# Patient Record
Sex: Male | Born: 1995
Health system: Southern US, Community
[De-identification: ages and names within clinical notes are randomized; demographics above are authoritative.]

## PROBLEM LIST (undated history)

## (undated) DIAGNOSIS — Z889 Allergy status to unspecified drugs, medicaments and biological substances status: Secondary | ICD-10-CM

## (undated) DIAGNOSIS — J302 Other seasonal allergic rhinitis: Secondary | ICD-10-CM

## (undated) DIAGNOSIS — F419 Anxiety disorder, unspecified: Secondary | ICD-10-CM

---
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
## 2017-03-13 ENCOUNTER — Encounter: Payer: Self-pay | Admitting: Family Medicine

## 2017-03-13 ENCOUNTER — Other Ambulatory Visit (HOSPITAL_COMMUNITY)
Admission: RE | Admit: 2017-03-13 | Discharge: 2017-03-13 | Disposition: A | Discharge status: Home / Self Care | Payer: Medicaid Other | Source: Ambulatory Visit | Attending: Family Medicine | Admitting: Family Medicine

## 2017-03-13 ENCOUNTER — Ambulatory Visit (INDEPENDENT_AMBULATORY_CARE_PROVIDER_SITE_OTHER): Payer: Medicaid Other | Admitting: Family Medicine

## 2017-03-13 VITALS — BP 110/70 | HR 53 | Temp 98.3°F | Wt 165.0 lb

## 2017-03-13 DIAGNOSIS — R300 Dysuria: Secondary | ICD-10-CM

## 2017-03-13 DIAGNOSIS — Z202 Contact with and (suspected) exposure to infections with a predominantly sexual mode of transmission: Secondary | ICD-10-CM | POA: Diagnosis present

## 2017-03-13 LAB — POCT URINALYSIS DIP (MANUAL ENTRY)
BILIRUBIN UA: NEGATIVE
Glucose, UA: NEGATIVE mg/dL
Ketones, POC UA: NEGATIVE mg/dL
LEUKOCYTES UA: NEGATIVE
NITRITE UA: NEGATIVE
PROTEIN UA: NEGATIVE mg/dL
RBC UA: NEGATIVE
Spec Grav, UA: >=1.03 — AB (ref 1.010–1.025)
UROBILINOGEN UA: 1 U/dL
pH, UA: 6 (ref 5.0–8.0)

## 2017-03-13 MED ORDER — AZITHROMYCIN 500 MG PO TABS
1000.0000 mg | ORAL_TABLET | Freq: Once | ORAL | Status: AC
  Administered 2017-03-13: 1000 mg via ORAL

## 2017-03-13 MED ORDER — CEFTRIAXONE SODIUM 250 MG IJ SOLR
250.0000 mg | Freq: Once | INTRAMUSCULAR | Status: AC
  Administered 2017-03-13: 250 mg via INTRAMUSCULAR

## 2017-03-13 NOTE — Patient Instructions (Signed)
I will contact you will the results of your labs.  If anything is abnormal, I will call you.  Otherwise, expect a copy to be mailed to you.  

## 2017-03-13 NOTE — Progress Notes (Signed)
   Subjective: CC: exposure to STI ZOX:WRUEAHPI:Christopher Townsend is a 21 y.o. male presenting to clinic today for same day appointment. PCP: Almon HerculesGonfa, Taye T, MD Concerns today include:  Patient reports that he has been having dysuria.  He notes that his partner told him she had chlamydia.  He notes he was treated for this in the past at urgent care.  He reports recent intercourse with her and thinks that he may have been reinfected.   He intermittently uses condoms.  Sexually active with females only.  Currently, only has 1 partner.Denies hematuria, penile discharge, genital lesions/ sores/ rashes, testicular swelling/ pain, nausea, vomiting, abdominal pain, fevers, chills.  He denies back pain, urgency, frequency.   No Known Allergies  Social Hx reviewed. MedHx, current medications and allergies reviewed.  Please see EMR. ROS: Per HPI  Objective: Office vital signs reviewed. BP 110/70   Pulse (!) 53   Temp 98.3 F (36.8 C) (Oral)   Wt 165 lb (74.8 kg)   SpO2 98%   Physical Examination:  General: Awake, alert, thin male, No acute distress HEENT: Normal, MMM, no oral lesions Cardio: regular rate and rhythm, S1S2 heard, no murmurs appreciated Pulm: clear to auscultation bilaterally, no wheezes, rhonchi or rales; normal work of breathing on room air GI: soft, non-tender, non-distended, bowel sounds present x4, no hepatomegaly, no splenomegaly, no masses; no suprapubic TTP MSK: no CVA TTP Skin: dry; intact; no rashes or lesions  Results for orders placed or performed in visit on 03/13/17 (from the past 24 hour(s))  POCT urinalysis dipstick     Status: Abnormal   Collection Time: 03/13/17  9:03 AM  Result Value Ref Range   Color, UA yellow yellow   Clarity, UA clear clear   Glucose, UA negative negative mg/dL   Bilirubin, UA negative negative   Ketones, POC UA negative negative mg/dL   Spec Grav, UA >=5.409>=1.030 (A) 1.010 - 1.025   Blood, UA negative negative   pH, UA 6.0 5.0 - 8.0   Protein Ur,  POC negative negative mg/dL   Urobilinogen, UA 1.0 0.2 or 1.0 E.U./dL   Nitrite, UA Negative Negative   Leukocytes, UA Negative Negative    Assessment/ Plan: 21 y.o. male   1. Possible exposure to STD.  Given known exposure, will empirically treat in office.  Discussed with patient, who would like to proceed with treatment while waiting for cytology results. - Urine cytology ancillary only, check GC/CT - Declines HIV/ RPR testing. - cefTRIAXone (ROCEPHIN) injection 250 mg; Inject 250 mg into the muscle once. - azithromycin (ZITHROMAX) tablet 1,000 mg; Take 2 tablets (1,000 mg total) by mouth once. - Safe sex practices reinforced. - Condoms provided  2. Dysuria.  Symptoms likely related to STI but will r/o UTI - Urinalysis Dipstick  Follow up with PCP prn.  Raliegh IpAshly M Arvie Bartholomew, DO PGY-3, [facility]

## 2017-03-14 LAB — URINE CYTOLOGY ANCILLARY ONLY
CHLAMYDIA, DNA PROBE: NEGATIVE
Neisseria Gonorrhea: NEGATIVE
Trichomonas: NEGATIVE

## 2017-03-18 ENCOUNTER — Encounter: Payer: Self-pay | Admitting: Family Medicine

## 2017-03-18 NOTE — Progress Notes (Signed)
Please call patient and inform him of negative results.

## 2017-03-19 ENCOUNTER — Telehealth: Payer: Self-pay

## 2017-03-19 NOTE — Telephone Encounter (Signed)
-----   Message from Raliegh IpAshly M Gottschalk, DO sent at 03/18/2017  3:14 PM EDT ----- Please call patient and inform of negative result.  Letter also sent

## 2017-03-19 NOTE — Telephone Encounter (Signed)
LVM for pt to call the office. If he calls, please let him know the test results were negative. Dr. Nadine CountsGottschalk has also mailed a letter to the pt. Christopher SpillersSharon T Adalyn Townsend, CMA

## 2017-03-20 ENCOUNTER — Telehealth: Payer: Self-pay | Admitting: *Deleted

## 2017-03-20 NOTE — Telephone Encounter (Signed)
Pt was advised. ep °

## 2017-03-20 NOTE — Telephone Encounter (Signed)
-----   Message from Ashly M Gottschalk, DO sent at 03/18/2017  3:14 PM EDT ----- Please call patient and inform of negative result.  Letter also sent 

## 2017-03-20 NOTE — Telephone Encounter (Signed)
LVM to call office to inform pt of below. Zimmerman Rumple, Granville Whitefield D, CMA  

## 2017-03-21 ENCOUNTER — Other Ambulatory Visit (HOSPITAL_COMMUNITY)
Admission: RE | Admit: 2017-03-21 | Discharge: 2017-03-21 | Disposition: A | Discharge status: Home / Self Care | Payer: Medicaid Other | Source: Ambulatory Visit | Attending: Family Medicine | Admitting: Family Medicine

## 2017-03-21 ENCOUNTER — Encounter: Payer: Self-pay | Admitting: Internal Medicine

## 2017-03-21 ENCOUNTER — Ambulatory Visit (INDEPENDENT_AMBULATORY_CARE_PROVIDER_SITE_OTHER): Payer: Medicaid Other | Admitting: Internal Medicine

## 2017-03-21 VITALS — BP 115/60 | Temp 98.2°F | Wt 172.8 lb

## 2017-03-21 DIAGNOSIS — Z202 Contact with and (suspected) exposure to infections with a predominantly sexual mode of transmission: Secondary | ICD-10-CM | POA: Diagnosis not present

## 2017-03-21 DIAGNOSIS — N4889 Other specified disorders of penis: Secondary | ICD-10-CM | POA: Diagnosis not present

## 2017-03-21 MED ORDER — PENICILLIN G BENZATHINE 1200000 UNIT/2ML IM SUSP
1.2000 10*6.[IU] | Freq: Once | INTRAMUSCULAR | Status: AC
  Administered 2017-03-21: 1.2 10*6.[IU] via INTRAMUSCULAR

## 2017-03-21 NOTE — Progress Notes (Signed)
   Subjective:    Christopher Townsend - 21 y.o. male MRN [id number]  Date of birth: [birth year]  HPI  Christopher Townsend is here for concern for STD and penile lesions.  Penile Lesions: Present for about 1-2 weeks around the head of his penis. Are not painful or pruritic. Has not noticed any drainage from the lesions. Denies blistering of the lesions. He reports that he is sexually active with one male partner. They only engage in vaginal intercourse. His partner has recently tested +Chlamydia and [facility] was [facility] one week prior to this office visit for STD screening. He was treated presumptively for Gonorrhea and Chlamydia at this visit with CTX and Azithromycin. He endorsed dysuria at this visit but reports it has resolved since treatment with abx. He denies hematuria and blood in semen. Reports that semen has been yellow in color.    Health Maintenance:  There are no preventive care reminders to display for this patient.  -  reports that he has never smoked. He has never used smokeless tobacco. - Review of Systems: Per HPI. - Past Medical History: Patient Active Problem List   Diagnosis Date Noted  . Tinea cruris 07/07/2015  . Viral respiratory illness 08/26/2014  . Generalized anxiety disorder 04/22/2014  . Pre-syncope 04/22/2014  . Mild tetrahydrocannabinol (THC) abuse 04/14/2014  . Tinea corporis 02/23/2014  . Nausea vomiting and diarrhea 11/21/2012  . Weight loss, intentional 11/23/2011   - Medications: reviewed and updated   Objective:   Physical Exam BP 115/60   Temp 98.2 F (36.8 C) (Oral)   Wt 172 lb 12.8 oz (78.4 kg)   SpO2 98%  Gen: NAD, alert, cooperative with exam, well-appearing GU: Multiple erythematous papules around the sulcus of the glans of the penis without drainage.  Neuro: A&Ox3. CN II-XII grossly intact. Strength and sensation of extremities intact and equal. Gait normal. Psych: good insight, alert and oriented   Assessment & Plan:   1. STD exposure/Penile  Papules  Patient with recent possible STD exposure. Lesions appear most consistent with HSV as there are multiple papules present; however, lesions are not painful nor vesicular. There is concern that this may possibly be an atypical appearance of primary syphilis. Have sent culture for HSV and will treat empirically for syphilis with IM PCN in clinic today. Have obtained RPR. If negative, will recommend repeat RPR in 2-4 weeks as serologic testing in early primary syphilis may be falsely negative. Also repeating GC/Chlamydia testing today. Lesions do not appear consistent with benign pearly penile papules.  - Urine cytology ancillary only - Herpes simplex virus culture - penicillin g benzathine (BICILLIN LA) 1200000 UNIT/2ML injection 1.2 Million Units; Inject 2 mLs (1.2 Million Units total) into the muscle once. - RPR - HIV antibody (with reflex)     Marcy Sirenatherine Wallace, D.O. 03/21/2017, 9:40 AM PGY-2, [facility]

## 2017-03-21 NOTE — Assessment & Plan Note (Signed)
Patient with recent possible STD exposure. Lesions appear most consistent with HSV as there are multiple papules present; however, lesions are not painful nor vesicular. There is concern that this may possibly be an atypical appearance of primary syphilis. Have sent culture for HSV and will treat empirically for syphilis with IM PCN in clinic today. Have obtained RPR. If negative, will recommend repeat RPR in 2-4 weeks as serologic testing in early primary syphilis may be falsely negative. Also repeating GC/Chlamydia testing today. Lesions do not appear consistent with benign pearly penile papules.  - Urine cytology ancillary only - Herpes simplex virus culture - penicillin g benzathine (BICILLIN LA) 1200000 UNIT/2ML injection 1.2 Million Units; Inject 2 mLs (1.2 Million Units total) into the muscle once. - RPR - HIV antibody (with reflex)

## 2017-03-21 NOTE — Patient Instructions (Signed)
We will call you with your lab results and give you further instructions. Please practice safe sex using condoms, as they are the only method to prevent transmission of STDs.    Safe Sex Practicing safe sex means taking steps before and during sex to reduce your risk of:  Getting an STD (sexually transmitted disease).  Giving your partner an STD.  Unwanted pregnancy.  How can I practice safe sex?  To practice safe sex:  Limit your sexual partners to only one partner who is having sex with only you.  Avoid using alcohol and recreational drugs before having sex. These substances can affect your judgment.  Before having sex with a new partner: ? Talk to your partner about past partners, past STDs, and drug use. ? You and your partner should be screened for STDs and discuss the results with each other.  Check your body regularly for sores, blisters, rashes, or unusual discharge. If you notice any of these problems, visit your health care provider.  If you have symptoms of an infection or you are being treated for an STD, avoid sexual contact.  While having sex, use a condom. Make sure to: ? Use a condom every time you have vaginal, oral, or anal sex. Both females and males should wear condoms during oral sex. ? Keep condoms in place from the beginning to the end of sexual activity. ? Use a latex condom, if possible. Latex condoms offer the best protection. ? Use only water-based lubricants or oils to lubricate a condom. Using petroleum-based lubricants or oils will weaken the condom and increase the chance that it will break.  See your health care provider for regular screenings, exams, and tests for STDs.  Talk with your health care provider about the form of birth control (contraception) that is best for you.  Get vaccinated against hepatitis B and human papillomavirus (HPV).  If you are at risk of being infected with HIV (human immunodeficiency virus), talk with your health  care provider about taking a prescription medicine to prevent HIV infection. You are considered at risk for HIV if: ? You are a man who has sex with other men. ? You are a heterosexual man or woman who is sexually active with more than one partner. ? You take drugs by injection. ? You are sexually active with a partner who has HIV.  This information is not intended to replace advice given to you by your health care provider. Make sure you discuss any questions you have with your health care provider. Document Released: 10/18/2004 Document Revised: 01/25/2016 Document Reviewed: 07/31/2015 Elsevier Interactive Patient Education  2018 [employer].   Sexually Transmitted Disease A sexually transmitted disease (STD) is a disease or infection that may be passed (transmitted) from person to person, usually during sexual activity. This may happen by way of saliva, semen, blood, vaginal mucus, or urine. Common STDs include:  Gonorrhea.  Chlamydia.  Syphilis.  HIV and AIDS.  Genital herpes.  Hepatitis B and C.  Trichomonas.  Human papillomavirus (HPV).  Pubic lice.  Scabies.  Mites.  Bacterial vaginosis.  What are the causes? An STD may be caused by bacteria, a virus, or parasites. STDs are often transmitted during sexual activity if one person is infected. However, they may also be transmitted through nonsexual means. STDs may be transmitted after:  Sexual intercourse with an infected person.  Sharing sex toys with an infected person.  Sharing needles with an infected person or using unclean piercing or tattoo  needles.  Having intimate contact with the genitals, mouth, or rectal areas of an infected person.  Exposure to infected fluids during birth.  What are the signs or symptoms? Different STDs have different symptoms. Some people may not have any symptoms. If symptoms are present, they may include:  Painful or bloody urination.  Pain in the pelvis, abdomen, vagina,  anus, throat, or eyes.  A skin rash, itching, or irritation.  Growths, ulcerations, blisters, or sores in the genital and anal areas.  Abnormal vaginal discharge with or without bad odor.  Penile discharge in men.  Fever.  Pain or bleeding during sexual intercourse.  Swollen glands in the groin area.  Yellow skin and eyes (jaundice). This is seen with hepatitis.  Swollen testicles.  Infertility.  Sores and blisters in the mouth.  How is this diagnosed? To make a diagnosis, your health care provider may:  Take a medical history.  Perform a physical exam.  Take a sample of any discharge to examine.  Swab the throat, cervix, opening to the penis, rectum, or vagina for testing.  Test a sample of your first morning urine.  Perform blood tests.  Perform a Pap test, if this applies.  Perform a colposcopy.  Perform a laparoscopy.  How is this treated? Treatment depends on the STD. Some STDs may be treated but not cured.  Chlamydia, gonorrhea, trichomonas, and syphilis can be cured with antibiotic medicine.  Genital herpes, hepatitis, and HIV can be treated, but not cured, with prescribed medicines. The medicines lessen symptoms.  Genital warts from HPV can be treated with medicine or by freezing, burning (electrocautery), or surgery. Warts may come back.  HPV cannot be cured with medicine or surgery. However, abnormal areas may be removed from the cervix, vagina, or vulva.  If your diagnosis is confirmed, your recent sexual partners need treatment. This is true even if they are symptom-free or have a negative culture or evaluation. They should not have sex until their health care providers say it is okay.  Your health care provider may test you for infection again 3 months after treatment.  How is this prevented? Take these steps to reduce your risk of getting an STD:  Use latex condoms, dental dams, and water-soluble lubricants during sexual activity. Do not  use petroleum jelly or oils.  Avoid having multiple sex partners.  Do not have sex with someone who has other sex partners.  Do not have sex with anyone you do not know or who is at high risk for an STD.  Avoid risky sex practices that can break your skin.  Do not have sex if you have open sores on your mouth or skin.  Avoid drinking too much alcohol or taking illegal drugs. Alcohol and drugs can affect your judgment and put you in a vulnerable position.  Avoid engaging in oral and anal sex acts.  Get vaccinated for HPV and hepatitis. If you have not received these vaccines in the past, talk to your health care provider about whether one or both might be right for you.  If you are at risk of being infected with HIV, it is recommended that you take a prescription medicine daily to prevent HIV infection. This is called pre-exposure prophylaxis (PrEP). You are considered at risk if: ? You are a man who has sex with other men (MSM). ? You are a heterosexual man or woman and are sexually active with more than one partner. ? You take drugs by injection. ? You  are sexually active with a partner who has HIV.  Talk with your health care provider about whether you are at high risk of being infected with HIV. If you choose to begin PrEP, you should first be tested for HIV. You should then be tested every 3 months for as long as you are taking PrEP.  Contact a health care provider if:  See your health care provider.  Tell your sexual partner(s). They should be tested and treated for any STDs.  Do not have sex until your health care provider says it is okay. Get help right away if: Contact your health care provider right away if:  You have severe abdominal pain.  You are a man and notice swelling or pain in your testicles.  You are a woman and notice swelling or pain in your vagina.  This information is not intended to replace advice given to you by your health care provider. Make sure  you discuss any questions you have with your health care provider. Document Released: 12/01/2002 Document Revised: 03/30/2016 Document Reviewed: 03/31/2013 Elsevier Interactive Patient Education  2018 [employer].

## 2017-03-22 LAB — URINE CYTOLOGY ANCILLARY ONLY
Chlamydia: NEGATIVE
Neisseria Gonorrhea: NEGATIVE

## 2017-03-22 LAB — HIV ANTIBODY (ROUTINE TESTING W REFLEX): HIV SCREEN 4TH GENERATION: NONREACTIVE

## 2017-03-22 LAB — RPR: RPR Ser Ql: NONREACTIVE

## 2017-03-24 LAB — HERPES SIMPLEX VIRUS CULTURE

## 2017-03-25 ENCOUNTER — Telehealth: Payer: Self-pay | Admitting: Student

## 2017-03-25 NOTE — Telephone Encounter (Signed)
Pt would like someone to call back with lab results. ep

## 2017-03-25 NOTE — Telephone Encounter (Signed)
Pt is calling to get his results explained to him. Myriam Jacobsonjw

## 2017-03-26 ENCOUNTER — Other Ambulatory Visit: Payer: Self-pay | Admitting: Internal Medicine

## 2017-03-26 DIAGNOSIS — N4889 Other specified disorders of penis: Secondary | ICD-10-CM

## 2017-03-26 NOTE — Telephone Encounter (Signed)
Routing to Dr. Earlene PlaterWallace. She saw him and ordered those labs.  Thanks! Alwyn Renaye

## 2017-03-26 NOTE — Telephone Encounter (Signed)
Called to discuss lab results with patient. Informed him of negative Gonorrhea, Chlamydia, RPR, HIV, and HSV. Given lesions on penis present at prior office visit want to ensure RPR was not a false negative as serologic testing in early Syphilis is not as reliable. Will order repeat RPR to be performed. Have instructed patient to make lab appointment for approximately 2 weeks from now.   Patient is concerned that semen is still yellow and that this means that he has Chlamydia as his partner tested positive several weeks ago. Discussed with patient that [facility] on 6/20 and that testing from that office visit as well as from office visit 6/28 yielded negative results. Discussed not having sexual intercourse with partner until she is treated and re-tested. Have recommended follow up with PCP Gonfa for further discussion.   Marcy Sirenatherine Audrinna Sherman, D.O. 03/26/2017, 10:42 AM PGY-3, [facility]

## 2017-03-26 NOTE — Telephone Encounter (Signed)
Would like lab results

## 2017-04-16 ENCOUNTER — Ambulatory Visit (HOSPITAL_COMMUNITY)
Admission: EM | Admit: 2017-04-16 | Discharge: 2017-04-16 | Disposition: A | Discharge status: Home / Self Care | Payer: Medicaid Other | Attending: Family Medicine | Admitting: Family Medicine

## 2017-04-16 ENCOUNTER — Encounter (HOSPITAL_COMMUNITY): Payer: Self-pay | Admitting: Family Medicine

## 2017-04-16 DIAGNOSIS — J029 Acute pharyngitis, unspecified: Secondary | ICD-10-CM | POA: Diagnosis not present

## 2017-04-16 MED ORDER — OMEPRAZOLE 20 MG PO CPDR: 20.0000 mg | DELAYED_RELEASE_CAPSULE | Freq: Every day | ORAL | 0 refills | Status: DC

## 2017-04-16 NOTE — ED Triage Notes (Signed)
Pt here for over a week of sore throat and drainage in the back of throat.

## 2017-04-17 NOTE — ED Provider Notes (Signed)
  [facility]   [id number] 04/16/17 Arrival Time: 1721  ASSESSMENT & PLAN:  1. Sore throat    Given duration of symptoms I'm curious is this is GERD related.  Meds ordered this encounter  Medications  . omeprazole (PRILOSEC) 20 MG capsule    Sig: Take 1 capsule (20 mg total) by mouth daily.    Dispense:  21 capsule    Refill:  0   Will give trial of omeprazole for 3 weeks. He will f/u if not improving.  Reviewed expectations re: course of current medical issues. Questions answered. Outlined signs and symptoms indicating need for more acute intervention. Patient verbalized understanding. After Visit Summary given.   SUBJECTIVE:  Christopher Townsend is a 21 y.o. male who presents with complaint of sore throat. Almost daily for several months. Does not limit him. Worse in the morning. Afebrile. No neck soreness or swelling. No self-treatment. No resp symptoms. No h/o reflux symptoms. Able to eat and drink normally. No voice changes. No new exposures.  ROS: As per HPI.   OBJECTIVE:  Vitals:   04/16/17 1736  BP: 117/66  Pulse: (!) 50  Resp: 18  Temp: 98.2 F (36.8 C)  SpO2: 98%     General appearance: alert; no distress HEENT: throat with mild erythema; tonsils are normal Neck: supple; no LAD; FROM Extremities: no cyanosis or edema; symmetrical with no gross deformities Skin: warm and dry   No Known Allergies  PMHx, SurgHx, SocialHx, Medications, and Allergies were reviewed in the Visit Navigator and updated as appropriate.      Mardella LaymanHagler, Ethan Kasperski, MD 04/17/17 [phone]

## 2017-05-03 ENCOUNTER — Telehealth: Payer: Self-pay | Admitting: Student

## 2017-05-03 NOTE — Telephone Encounter (Signed)
Pt would like a referral to a dermatogist for inflamation  on his private area.  Please advise

## 2017-05-03 NOTE — Telephone Encounter (Signed)
I have not evaluated the patient for this problem. It seems he saw Dr. Earlene PlaterWallace about a month ago. Please call the patient and advise him to schedule an office visit. Thank you, Alwyn Renaye

## 2017-05-07 NOTE — Telephone Encounter (Signed)
Contacted pt and scheduled an appointment for 8/16 with PCP to be evaluated for this referral. Lamonte SakaiZimmerman Rumple, Luellen Howson D, CMA

## 2017-05-08 ENCOUNTER — Encounter (HOSPITAL_COMMUNITY): Payer: Self-pay | Admitting: *Deleted

## 2017-05-08 ENCOUNTER — Emergency Department (HOSPITAL_COMMUNITY)
Admission: EM | Admit: 2017-05-08 | Discharge: 2017-05-08 | Disposition: A | Discharge status: Home / Self Care | Payer: Medicaid Other | Attending: Emergency Medicine | Admitting: Emergency Medicine

## 2017-05-08 DIAGNOSIS — R369 Urethral discharge, unspecified: Secondary | ICD-10-CM | POA: Diagnosis present

## 2017-05-08 DIAGNOSIS — Z79899 Other long term (current) drug therapy: Secondary | ICD-10-CM | POA: Insufficient documentation

## 2017-05-08 DIAGNOSIS — Z711 Person with feared health complaint in whom no diagnosis is made: Secondary | ICD-10-CM

## 2017-05-08 LAB — URINALYSIS, ROUTINE W REFLEX MICROSCOPIC
Bilirubin Urine: NEGATIVE
Glucose, UA: NEGATIVE mg/dL
Hgb urine dipstick: NEGATIVE
Ketones, ur: NEGATIVE mg/dL
Leukocytes, UA: NEGATIVE
Nitrite: NEGATIVE
PROTEIN: NEGATIVE mg/dL
Specific Gravity, Urine: 1.031 — ABNORMAL HIGH (ref 1.005–1.030)
pH: 6 (ref 5.0–8.0)

## 2017-05-08 MED ORDER — METRONIDAZOLE 500 MG PO TABS
2000.0000 mg | ORAL_TABLET | Freq: Once | ORAL | Status: AC
Start: 2017-05-08 — End: 2017-05-08
  Administered 2017-05-08: 2000 mg via ORAL
  Filled 2017-05-08: qty 4

## 2017-05-08 MED ORDER — BACITRACIN ZINC 500 UNIT/GM EX OINT: 1.0000 "application " | TOPICAL_OINTMENT | Freq: Two times a day (BID) | CUTANEOUS | 1 refills | Status: DC

## 2017-05-08 NOTE — ED Triage Notes (Signed)
Pt states has been tx for gonorrhea and chlamydia and has tested negative.  However, he continues to have purulent discharge, yellow semen and an irritated glans penis.

## 2017-05-08 NOTE — ED Provider Notes (Signed)
[facility] Provider Note   CSN: [id number] Arrival date & time: 05/08/17  1104     History   Chief Complaint Chief Complaint  Patient presents with  . Penile Discharge    HPI Johnmichael Melhorn is a 21 y.o. male.  Chief Walkup is a 21 y.o. Male who presents to the ED complaining of ongoing penile discharge and irritation for several months. Patient reports he has been tested for HIV, syphilis, gonorrhea and chlamydia and this was negative. He reports he is most recently tested about 2 months ago. He tells me he was treated empirically at the time for gonorrhea and Chlamydia without relief of his symptoms. He reports he's continued to have yellowish penile discharge as well as itching at the tip of his penis and irritation on the glans of his penis. He is unable to identify alleviating or aggravating factors. His symptoms are not worse after intercourse. He reports he sexually active and uses protection all the time. He denies worsening symptoms after using latex condoms. He denies any dysuria or hematuria. He denies any difficulty urinating. No known STD contacts. He is circumcised. He denies fevers, abdominal pain, dysuria, hematuria, testicle pain, nausea, vomiting or diarrhea.   The history is provided by the patient and medical records. No language interpreter was used.  Penile Discharge  Pertinent negatives include no abdominal pain.    Past Medical History:  Diagnosis Date  . Anxiety   . Multiple allergies   . Seasonal allergies     Patient Active Problem List   Diagnosis Date Noted  . Penile papules 03/21/2017  . Tinea cruris 07/07/2015  . Viral respiratory illness 08/26/2014  . Generalized anxiety disorder 04/22/2014  . Pre-syncope 04/22/2014  . Mild tetrahydrocannabinol (THC) abuse 04/14/2014  . Tinea corporis 02/23/2014  . Nausea vomiting and diarrhea 11/21/2012  . Weight loss, intentional 11/23/2011    History reviewed. No pertinent surgical  history.     Home Medications    Prior to Admission medications   Medication Sig Start Date End Date Taking? Authorizing Provider  bacitracin ointment Apply 1 application topically 2 (two) times daily. 05/08/17   Everlene Farrier, PA-C  clotrimazole-betamethasone (LOTRISONE) cream Apply 1 application topically 2 (two) times daily. 07/07/16   Alene Mires, NP  ibuprofen (ADVIL,MOTRIN) 400 MG tablet Take 1 tablet (400 mg total) by mouth every 6 (six) hours as needed. 01/11/16   Roxy Horseman, PA-C  ketoconazole (NIZORAL) 2 % cream Apply 1 application topically 2 (two) times daily. 07/19/15   Ardith Dark, MD  omeprazole (PRILOSEC) 20 MG capsule Take 1 capsule (20 mg total) by mouth daily. 04/16/17   Mardella Layman, MD    Family History Family History  Problem Relation Age of Onset  . Asthma Neg Hx   . Cancer Neg Hx   . Diabetes Neg Hx   . Heart failure Neg Hx   . Hyperlipidemia Neg Hx     Social History Social History  Substance Use Topics  . Smoking status: Never Smoker  . Smokeless tobacco: Never Used  . Alcohol use No     Allergies   Patient has no known allergies.   Review of Systems Review of Systems  Constitutional: Negative for chills and fever.  HENT: Negative for mouth sores.   Gastrointestinal: Negative for abdominal pain, nausea and vomiting.  Genitourinary: Positive for discharge and genital sores. Negative for decreased urine volume, difficulty urinating, dysuria, flank pain, frequency, hematuria, penile pain, penile swelling, scrotal swelling, testicular  pain and urgency.  Skin: Positive for rash. Negative for wound.     Physical Exam Updated Vital Signs BP 117/65   Pulse (!) 57   Temp 98.4 F (36.9 C) (Oral)   Resp 18   Ht 6' (1.829 m)   Wt 79.4 kg (175 lb)   SpO2 98%   BMI 23.73 kg/m   Physical Exam  Constitutional: He appears well-developed and well-nourished. No distress.  HENT:  Head: Normocephalic and atraumatic.   Mouth/Throat: Oropharynx is clear and moist.  Eyes: Right eye exhibits no discharge. Left eye exhibits no discharge.  Pulmonary/Chest: Effort normal. No respiratory distress.  Abdominal: Soft. There is no tenderness.  Genitourinary: No penile tenderness.  Genitourinary Comments: GU exam with male RN chaperone. Patient is circumcised. Slight erythema noted to the head of his penis. No discharge noted. No penile tenderness to palpation. No testicular tenderness to palpation. No other rashes noted.  Neurological: He is alert. Coordination normal.  Skin: No rash noted. He is not diaphoretic.  Psychiatric: He has a normal mood and affect. His behavior is normal.  Nursing note and vitals reviewed.    ED Treatments / Results  Labs (all labs ordered are listed, but only abnormal results are displayed) Labs Reviewed  URINALYSIS, ROUTINE W REFLEX MICROSCOPIC - Abnormal; Notable for the following:       Result Value   Color, Urine AMBER (*)    Specific Gravity, Urine 1.031 (*)    All other components within normal limits  GC/CHLAMYDIA PROBE AMP ([place]) NOT AT [facility]    EKG  EKG Interpretation None       Radiology No results found.  Procedures Procedures (including critical care time)  Medications Ordered in ED Medications  metroNIDAZOLE (FLAGYL) tablet 2,000 mg (not administered)     Initial Impression / Assessment and Plan / ED Course  I have reviewed the triage vital signs and the nursing notes.  Pertinent labs & imaging results that were available during my care of the patient were reviewed by me and considered in my medical decision making (see chart for details).     This  is a 21 y.o. Male who presents to the ED complaining of ongoing penile discharge and irritation for several months. Patient reports he has been tested for HIV, syphilis, gonorrhea and chlamydia and this was negative. He reports he is most recently tested about 2 months ago. He tells me he was  treated empirically at the time for gonorrhea and Chlamydia without relief of his symptoms. He reports he's continued to have yellowish penile discharge as well as itching at the tip of his penis and irritation on the glans of his penis. He is unable to identify alleviating or aggravating factors. His symptoms are not worse after intercourse. He reports he sexually active and uses protection all the time. He denies worsening symptoms after using latex condoms. He denies any dysuria or hematuria. He denies any difficulty urinating. No known STD contacts. He is circumcised. On exam the patient is afebrile nontoxic appearing. His erythema noted to the head of his penis. No evidence of penile discharge on exam. No testicular tenderness to palpation. No vesicles or bulla. No tenderness to palpation. Patient is been treated for gonorrhea and Chlamydia previously without relief of his symptoms. He complains of lots of itching and irritation to the tip of his penis. Question if this is from trichomonas. Urinalysis is unremarkable. We'll go ahead and cover him for trichomonas with 2 g of  Flagyl. Gonorrhea and Chlamydia swab sent to the lab. Patient declined HIV and syphilis testing at this time. We'll also provide him with bacitracin ointment for his inflamed glands. I encouraged him to follow-up at the health department to follow-up on his test results closely. I discussed safe sex practices. I advised the patient to follow-up with their primary care provider this week. I advised the patient to return to the emergency department with new or worsening symptoms or new concerns. The patient verbalized understanding and agreement with plan.     Final Clinical Impressions(s) / ED Diagnoses   Final diagnoses:  Penile discharge  Concern about STD in male without diagnosis    New Prescriptions New Prescriptions   BACITRACIN OINTMENT    Apply 1 application topically 2 (two) times daily.     Everlene FarrierDansie, Jamonica Schoff,  PA-C 05/08/17 1354    Arby BarrettePfeiffer, Marcy, MD 05/16/17 [phone]

## 2017-05-09 ENCOUNTER — Ambulatory Visit: Payer: Medicaid Other | Admitting: Student

## 2017-05-09 LAB — GC/CHLAMYDIA PROBE AMP (~~LOC~~) NOT AT ARMC
Chlamydia: NEGATIVE
Neisseria Gonorrhea: NEGATIVE

## 2017-05-23 ENCOUNTER — Ambulatory Visit (INDEPENDENT_AMBULATORY_CARE_PROVIDER_SITE_OTHER): Payer: Medicaid Other | Admitting: Family Medicine

## 2017-05-23 VITALS — BP 118/70 | HR 62 | Temp 98.6°F | Ht 72.0 in | Wt 161.8 lb

## 2017-05-23 DIAGNOSIS — R12 Heartburn: Secondary | ICD-10-CM | POA: Insufficient documentation

## 2017-05-23 DIAGNOSIS — L858 Other specified epidermal thickening: Secondary | ICD-10-CM | POA: Insufficient documentation

## 2017-05-23 HISTORY — DX: Heartburn: R12

## 2017-05-23 MED ORDER — RANITIDINE HCL 150 MG PO TABS: 150.0000 mg | ORAL_TABLET | Freq: Two times a day (BID) | ORAL | 2 refills | Status: DC

## 2017-05-23 NOTE — Progress Notes (Signed)
   CC: rash, heartburn  HPI Rash - bilateral ACs, neck. Itchy. Present for a couple of months. Never had it before. Tried witchhazel on his face. Tried selsun blue on his ACs. No bleeding from the rash. Hx of tinea corporis, no hx of eczema. Concerned about the mild hyperpigmentation.  Heartburn - heartburn, sour taste, spitting. Been present for "a long time." "Didn't think it was serious until I did some research on it." No vomiting. No unintentional weight loss. No blood in stool. No tums. Previously given prilosec and didn't pick it up. No dysphagia. Cannot identify any food triggers, denies drinking soda or coffee. Reports he eats mostly sandwiches.  ROS: denies CP, SOB, abd pain, dysuria, changes in BMs.    CC, SH/smoking status, and VS noted  Objective: BP 118/70 (BP Location: Right Arm, Patient Position: Sitting, Cuff Size: Normal)   Pulse 62   Temp 98.6 F (37 C) (Oral)   Ht 6' (1.829 m)   Wt 161 lb 12.8 oz (73.4 kg)   SpO2 98%   BMI 21.94 kg/m  Gen: NAD, alert, cooperative, and pleasant. HEENT: NCAT, EOMI CV: RRR, no murmur Resp: CTAB, no wheezes, non-labored Skin: mildly hyperpigmented pinpoint papules over bilateral antecubital fossae and anterior neck. No excoriation or skin breakdown. Ext: No edema, warm Neuro: Alert and oriented, Speech clear, No gross deficits  Assessment and plan:  Keratosis pilaris Mild presentation. New. Mildly raised hyperpigmented and hypopigmented papules over antecubital fossae and neck. Rough to touch. Patient has not tried anything for this, recommended copious hydration with emollients.  Heartburn New, previously given prilosec from ED, but never picked it up. No red flags. Recommended Tums PRN as well as zantac and fill prilosec if this does not improve symptoms after 1 week. RTC if worsening.  Loni MuseKate Raney Koeppen, MD, PGY2 05/23/2017 9:33 AM

## 2017-05-23 NOTE — Patient Instructions (Signed)
It was a pleasure to see you today! Thank you for choosing [facility] for your primary care. Christopher Townsend was seen for rash, heartburn.   Our plans for today were:  For your rash, apply LOTS of hydration every night before bed - either vaseline or Eucerin unscented ointment. Let this soak in overnight and apply as needed during the day.   For your heartburn, try Tums after each meal. If Tums aren't helping, try over the counter Zantac/ranitidine. If this doesn't help after several days, you can fill the previously prescribed prilosec.   You should come back to get your flu shot once we receive the supply later this month.  Best,  Dr. Chanetta Marshallimberlake

## 2017-05-23 NOTE — Assessment & Plan Note (Signed)
Mild presentation. New. Mildly raised hyperpigmented and hypopigmented papules over antecubital fossae and neck. Rough to touch. Patient has not tried anything for this, recommended copious hydration with emollients.

## 2017-05-23 NOTE — Assessment & Plan Note (Signed)
New, previously given prilosec from ED, but never picked it up. No red flags. Recommended Tums PRN as well as zantac and fill prilosec if this does not improve symptoms after 1 week. RTC if worsening.

## 2017-05-28 ENCOUNTER — Encounter: Payer: Self-pay | Admitting: Student

## 2017-05-28 ENCOUNTER — Encounter (HOSPITAL_COMMUNITY): Payer: Self-pay | Admitting: Emergency Medicine

## 2017-05-28 ENCOUNTER — Emergency Department (HOSPITAL_COMMUNITY)
Admission: EM | Admit: 2017-05-28 | Discharge: 2017-05-28 | Disposition: A | Discharge status: Home / Self Care | Payer: Medicaid Other | Attending: Emergency Medicine | Admitting: Emergency Medicine

## 2017-05-28 ENCOUNTER — Ambulatory Visit (INDEPENDENT_AMBULATORY_CARE_PROVIDER_SITE_OTHER): Payer: Medicaid Other | Admitting: Student

## 2017-05-28 VITALS — BP 108/60 | HR 70 | Temp 98.0°F | Ht 72.0 in | Wt 161.2 lb

## 2017-05-28 DIAGNOSIS — Z113 Encounter for screening for infections with a predominantly sexual mode of transmission: Secondary | ICD-10-CM | POA: Diagnosis not present

## 2017-05-28 DIAGNOSIS — F419 Anxiety disorder, unspecified: Secondary | ICD-10-CM | POA: Diagnosis not present

## 2017-05-28 DIAGNOSIS — F411 Generalized anxiety disorder: Secondary | ICD-10-CM | POA: Diagnosis not present

## 2017-05-28 DIAGNOSIS — R360 Urethral discharge without blood: Secondary | ICD-10-CM | POA: Insufficient documentation

## 2017-05-28 DIAGNOSIS — Z79899 Other long term (current) drug therapy: Secondary | ICD-10-CM | POA: Diagnosis not present

## 2017-05-28 DIAGNOSIS — N4889 Other specified disorders of penis: Secondary | ICD-10-CM | POA: Diagnosis present

## 2017-05-28 DIAGNOSIS — Z711 Person with feared health complaint in whom no diagnosis is made: Secondary | ICD-10-CM

## 2017-05-28 NOTE — ED Triage Notes (Signed)
Pt reports penile discharge for the past few months as well as pain around his penis, states he has been tested and treated for multiple STDs with no relief. Currently using bacitracin that he states has helped some.

## 2017-05-28 NOTE — Discharge Instructions (Signed)
Return if any problems.

## 2017-05-28 NOTE — ED Provider Notes (Signed)
[facility] Provider Note   CSN: [id number] Arrival date & time: 05/28/17  [id number]     History   Chief Complaint Chief Complaint  Patient presents with  . Penile Discharge    HPI Christopher Townsend is a 21 y.o. male.  Pt is concerned about std's and something being wrong with his penis.  [facility] and came here from there.  Pt is requesting the name of a urologist.  Pt reports his ejaculate is a different color.  No discharge.     The history is provided by the patient.  Penile Discharge  This is a recurrent problem. The problem occurs constantly. Nothing aggravates the symptoms. Nothing relieves the symptoms. He has tried nothing for the symptoms. The treatment provided no relief.    Past Medical History:  Diagnosis Date  . Anxiety   . Multiple allergies   . Seasonal allergies     Patient Active Problem List   Diagnosis Date Noted  . Keratosis pilaris 05/23/2017  . Heartburn 05/23/2017  . 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 Generalized anxiety disorder 04/22/2014  . Pre-syncope 04/22/2014  . Mild tetrahydrocannabinol (THC) abuse 04/14/2014  . Tinea corporis 02/23/2014  . Weight loss, intentional 11/23/2011    History reviewed. No pertinent surgical history.     Home Medications    Prior to Admission medications   Medication Sig Start Date End Date Taking? Authorizing Provider  bacitracin ointment Apply 1 application topically 2 (two) times daily. 05/08/17   Everlene Farrieransie, William, PA-C  ibuprofen (ADVIL,MOTRIN) 400 MG tablet Take 1 tablet (400 mg total) by mouth every 6 (six) hours as needed. 01/11/16   Roxy HorsemanBrowning, Robert, PA-C  omeprazole (PRILOSEC) 20 MG capsule Take 1 capsule (20 mg total) by mouth daily. 04/16/17   Mardella LaymanHagler, Brian, MD  ranitidine (ZANTAC) 150 MG tablet Take 1 tablet (150 mg total) by mouth 2 (two) times daily. 05/23/17   Garth Bignessimberlake, Kathryn, MD    Family History Family History  Problem Relation Age of Onset  .  Asthma Neg Hx   . Cancer Neg Hx   . Diabetes Neg Hx   . Heart failure Neg Hx   . Hyperlipidemia Neg Hx     Social History Social History  Substance Use Topics  . Smoking status: Never Smoker  . Smokeless tobacco: Never Used  . Alcohol use No     Allergies   Patient has no known allergies.   Review of Systems Review of Systems  Genitourinary: Positive for discharge.  All other systems reviewed and are negative.    Physical Exam Updated Vital Signs BP 116/79 (BP Location: Left Arm)   Pulse 80   Temp 97.9 F (36.6 C) (Oral)   Resp 17   SpO2 98%   Physical Exam  Constitutional: He appears well-developed and well-nourished.  HENT:  Head: Normocephalic and atraumatic.  Eyes: Conjunctivae are normal.  Neck: Neck supple.  Cardiovascular: Normal rate and regular rhythm.   No murmur heard. Pulmonary/Chest: Effort normal and breath sounds normal. No respiratory distress.  Abdominal: Soft. There is no tenderness.  Musculoskeletal: He exhibits no edema.  Neurological: He is alert.  Skin: Skin is warm and dry.  Psychiatric: He has a normal mood and affect.  Nursing note and vitals reviewed.    ED Treatments / Results  Labs (all labs ordered are listed, but only abnormal results are displayed) Labs Reviewed  GC/CHLAMYDIA PROBE AMP ([place]) NOT AT [facility]  EKG  EKG Interpretation None       Radiology No results found.  Procedures Procedures (including critical care time)  Medications Ordered in ED Medications - No data to display   Initial Impression / Assessment and Plan / ED Course  I have reviewed the triage vital signs and the nursing notes.  Pertinent labs & imaging results that were available during my care of the patient were reviewed by me and considered in my medical decision making (see chart for details).     Pt wants gc and ct testing.  Pt just had rpr and hiv.  He has not had sex since.  [facility].     Final Clinical Impressions(s) / ED Diagnoses   Final diagnoses:  Concern about STD in male without diagnosis    New Prescriptions New Prescriptions   No medications on file  An After Visit Summary was printed and given to the patient.   Elson Areas, PA-C 05/28/17 1103    Doug Sou, MD 05/28/17 1754

## 2017-05-28 NOTE — Progress Notes (Signed)
  Subjective:    Christopher Townsend is a 21 y.o. old male here for checked out for STD  HPI STD: her reports pus and discharge in semen for the last four months. He also reports burning on glans penis for about 4 months as well. Denies dysuria, increased frequency of urination, hesitancy or urgency. Denies scrotal swelling or tenderness. Reports some bumps on the private area. He was sexually active with two male partners in the last twelve months. Reports occ condom use. Last sexual intercourse was 6 months ago. Recently tested for HIV, GC & CT which were negative. Tested for Trichomonas about 2 months ago, which was negative PMH/Problem List: has Weight loss, intentional; Tinea corporis; Mild tetrahydrocannabinol (THC) abuse; Generalized anxiety disorder; Pre-syncope; Pearly penile papules; Keratosis pilaris; and Heartburn on his problem list.   has a past medical history of Anxiety; Multiple allergies; and Seasonal allergies.  FH:  Family History  Problem Relation Age of Onset  . Asthma Neg Hx   . Cancer Neg Hx   . Diabetes Neg Hx   . Heart failure Neg Hx   . Hyperlipidemia Neg Hx     SH Social History  Substance Use Topics  . Smoking status: Never Smoker  . Smokeless tobacco: Never Used  . Alcohol use No    Review of Systems Review of systems negative except for pertinent positives and negatives in history of present illness above.     Objective:     Vitals:   05/28/17 0835  BP: 108/60  Pulse: 70  Temp: 98 F (36.7 C)  TempSrc: Oral  SpO2: 99%  Weight: 161 lb 3.2 oz (73.1 kg)  Height: 6' (1.829 m)   Body mass index is 21.86 kg/m.  Physical Exam GEN: appears well, no apparent distress. CVS: RRR, nl S1&S2, no edema RESP: no IWOB GU: no suprapubic. No apparent lesion or discharge except for pearly penile papules. No erythema or inguinal LAD. No scrotal swelling or tenderness.  MSK: no focal tenderness or notable swelling SKIN: as above NEURO: alert and oiented  appropriately, no gross deficits  PSYCH: flat affect GAD 7 : Generalized Anxiety Score 05/28/2017  Nervous, Anxious, on Edge 0  Control/stop worrying 1  Worry too much - different things 0  Trouble relaxing 0  Restless 0  Easily annoyed or irritable 0  Afraid - awful might happen 0  Total GAD 7 Score 1  Anxiety Difficulty Not difficult at all   Assessment and Plan:  1. Pearly penile papules: last sexually intercourse about 6 months ago. He has had multiple tests for STD which were negative. Exam unremarkable except for pearly penile papules. Reassured him about this.  Patient has history of anxiety which could be driving his frequent visit to ED or clinic. However, his GAD 7 is only 1.   2. Generalized anxiety disorder: GAD-7:1 today. This may not reflect the extent of his anxiety that has been driving his frequent visit to ED or clinic for similar issues. He was once started on Buspar but didn't comply.  I tried to reassure him about his concern today.   Return if symptoms worsen or fail to improve.  Christopher Herculesaye T Faizan Geraci, MD 05/28/17 Pager: [phone]

## 2017-05-28 NOTE — Patient Instructions (Signed)
It was great seeing you today! We have addressed the following issues today 1. Burns on the penis: pearly penile papules. Pearly penile papules are a benign, normal anatomic variant. They present as multiple, uniform lesions arranged circumferentially in single or multiple rows that partially or completely encircle the corona and coronal sulcus of the glans penis. Individual papules are 1 to 2 mm in diameter, have a smooth or dome-shaped top, and range in color from translucent to flesh color, white, pink, or yellow. 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 Typically, the lesions become less prominent or disappear with age and rarely are seen after 19 years of age     If we did any lab work today, and the results require attention, either me or my nurse will get in touch with you. If everything is normal, you will get a letter in mail and a message via . If you don't hear from us in two weeks, please give us a call. Otherwise, we look forward to seeing you again at your next visit. If you have any questions or concerns before then, please call the clinic at [phone].  Please bring all your medications to every doctors visit  Sign up for My Chart to have easy access to your labs results, and communication with your Primary care physician.    Please check-out at the front desk before leaving the clinic.    Take Care,   Dr. Alanda SlimGonfa

## 2017-05-30 LAB — GC/CHLAMYDIA PROBE AMP (~~LOC~~) NOT AT ARMC
CHLAMYDIA, DNA PROBE: NEGATIVE
Neisseria Gonorrhea: NEGATIVE

## 2017-06-13 ENCOUNTER — Emergency Department (HOSPITAL_COMMUNITY)
Admission: EM | Admit: 2017-06-13 | Discharge: 2017-06-13 | Disposition: A | Discharge status: Home / Self Care | Payer: Medicaid Other | Attending: Emergency Medicine | Admitting: Emergency Medicine

## 2017-06-13 ENCOUNTER — Encounter (HOSPITAL_COMMUNITY): Payer: Self-pay | Admitting: Emergency Medicine

## 2017-06-13 DIAGNOSIS — R369 Urethral discharge, unspecified: Secondary | ICD-10-CM | POA: Insufficient documentation

## 2017-06-13 DIAGNOSIS — Z5321 Procedure and treatment not carried out due to patient leaving prior to being seen by health care provider: Secondary | ICD-10-CM | POA: Insufficient documentation

## 2017-06-13 DIAGNOSIS — R21 Rash and other nonspecific skin eruption: Secondary | ICD-10-CM | POA: Insufficient documentation

## 2017-06-13 NOTE — ED Notes (Signed)
NO ANSWER IN LOBBY X 2 

## 2017-06-13 NOTE — ED Notes (Signed)
Patient called x 2 with no response. 

## 2017-06-13 NOTE — ED Notes (Signed)
Call and no answer times one

## 2017-06-13 NOTE — ED Triage Notes (Signed)
Pt has rash to his lower arms as well as rash in his throat area, pt also states he has had penile discharge. Denies any pain.

## 2017-06-17 ENCOUNTER — Encounter (HOSPITAL_COMMUNITY): Payer: Self-pay | Admitting: *Deleted

## 2017-06-17 ENCOUNTER — Ambulatory Visit (HOSPITAL_COMMUNITY)
Admission: EM | Admit: 2017-06-17 | Discharge: 2017-06-17 | Disposition: A | Discharge status: Home / Self Care | Payer: Medicaid Other | Attending: Family Medicine | Admitting: Family Medicine

## 2017-06-17 ENCOUNTER — Ambulatory Visit: Payer: Medicaid Other | Admitting: Student

## 2017-06-17 ENCOUNTER — Emergency Department (HOSPITAL_COMMUNITY)
Admission: EM | Admit: 2017-06-17 | Discharge: 2017-06-17 | Disposition: A | Discharge status: Home / Self Care | Payer: Medicaid Other

## 2017-06-17 DIAGNOSIS — R369 Urethral discharge, unspecified: Secondary | ICD-10-CM | POA: Diagnosis not present

## 2017-06-17 DIAGNOSIS — R21 Rash and other nonspecific skin eruption: Secondary | ICD-10-CM | POA: Diagnosis not present

## 2017-06-17 DIAGNOSIS — K1329 Other disturbances of oral epithelium, including tongue: Secondary | ICD-10-CM | POA: Diagnosis not present

## 2017-06-17 DIAGNOSIS — Z711 Person with feared health complaint in whom no diagnosis is made: Secondary | ICD-10-CM

## 2017-06-17 LAB — POCT RAPID STREP A: STREPTOCOCCUS, GROUP A SCREEN (DIRECT): NEGATIVE

## 2017-06-17 NOTE — ED Triage Notes (Signed)
Rash   On  l  Arm    -  Also  Has  A  Penile   Discharge       X  6  Months      Pt  States   He  Was treated      For  gc  But    It  Keeps  Coming  Back

## 2017-06-17 NOTE — ED Notes (Signed)
Called and no answer.

## 2017-06-17 NOTE — ED Notes (Signed)
Called pt no answer for triage. One pt said might have left. He came in after her

## 2017-06-17 NOTE — ED Provider Notes (Signed)
[facility]    CSN: [id number] Arrival date & time: 06/17/17  1223     History   Chief Complaint Chief Complaint  Patient presents with  . Rash    HPI Christopher Townsend is a 21 y.o. male.   21 year old male comes in for multiple complaints:  1. Penile discharge for the past 6 months. This has been evaluated multiple times, by PCP and emergency department. He was seen by his PCP 05/28/2017 and was told he has pearly penile papules. He was tested for gonorrhea and chlamydia the same day by the emergency department, which was negative. Patient has an appointment with urology tomorrow for further assessment. He is asking for STD check including gonorrhea, chlamydia, Trichomonas, herpes. He has not been sexually active since his last checkup 1 month ago. Discussed prior results with patient, he would still like to be tested. He states he found quite discharge/patch on his right tonsils, and would like to be checked for oral STDs as well. Denies fever, chills, night sweats. Denies penile pain, penile sores/lesions, penile swelling, testicular pain, swelling. Denies abdominal pain, nausea, vomiting, diarrhea. Denies history of herpes.  2. Patient states he noticed rash on his bilateral arms and back starting this morning. Denies any new exposures, insect bites. The state that rash on his back is itchy in nature. Denies spreading erythema, increased warmth, increased pain. Denies history of eczema. Patient has not tried anything for his symptoms.      Past Medical History:  Diagnosis Date  . Anxiety   . Multiple allergies   . Seasonal allergies     Patient Active Problem List   Diagnosis Date Noted  . Keratosis pilaris 05/23/2017  . Heartburn 05/23/2017  . 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 Generalized anxiety disorder 04/22/2014  . Pre-syncope 04/22/2014  . Mild tetrahydrocannabinol (THC) abuse 04/14/2014  . Tinea corporis 02/23/2014  . Weight loss, intentional 11/23/2011      History reviewed. No pertinent surgical history.     Home Medications    Prior to Admission medications   Not on File    Family History Family History  Problem Relation Age of Onset  . Asthma Neg Hx   . Cancer Neg Hx   . Diabetes Neg Hx   . Heart failure Neg Hx   . Hyperlipidemia Neg Hx     Social History Social History  Substance Use Topics  . Smoking status: Never Smoker  . Smokeless tobacco: Never Used  . Alcohol use No     Allergies   Patient has no known allergies.   Review of Systems Review of Systems  Reason unable to perform ROS: See HPI as above.     Physical Exam Triage Vital Signs ED Triage Vitals [06/17/17 1355]  Enc Vitals Group     BP 110/70     Pulse Rate 78     Resp 18     Temp 98.4 F (36.9 C)     Temp Source Oral     SpO2 100 %     Weight      Height      Head Circumference      Peak Flow      Pain Score      Pain Loc      Pain Edu?      Excl. in GC?    No data found.   Updated Vital Signs BP 110/70 (BP Location: Right Arm)   Pulse 78   Temp  98.4 F (36.9 C) (Oral)   Resp 18   SpO2 100%   Physical Exam  Constitutional: He is oriented to person, place, and time. He appears well-developed and well-nourished. No distress.  HENT:  Head: Normocephalic and atraumatic.  Mouth/Throat: Uvula is midline, oropharynx is clear and moist and mucous membranes are normal. Tonsils are 1+ on the right. Tonsils are 1+ on the left. Tonsillar exudate (right tonsil).  Eyes: Pupils are equal, round, and reactive to light. Conjunctivae are normal.  Neurological: He is alert and oriented to person, place, and time.  Skin:  See pictures below. Was not able to visualize bilateral forearm rash. Patient states rash was similar to what he shows me on his shoulder, which is picture #2, and appears to be the hair follicles.            UC Treatments / Results  Labs (all labs ordered are listed, but only abnormal results are  displayed) Labs Reviewed  HSV 1 ANTIBODY, IGG  HSV 2 ANTIBODY, IGG  POCT RAPID STREP A  URINE CYTOLOGY ANCILLARY ONLY  CYTOLOGY, (ORAL, ANAL, URETHRAL) ANCILLARY ONLY    EKG  EKG Interpretation None       Radiology No results found.  Procedures Procedures (including critical care time)  Medications Ordered in UC Medications - No data to display   Initial Impression / Assessment and Plan / UC Course  I have reviewed the triage vital signs and the nursing notes.  Pertinent labs & imaging results that were available during my care of the patient were reviewed by me and considered in my medical decision making (see chart for details).    No obvious rash appreciated, except for dry skin vs eczema of patient's back. Patient seems anxious that rash could be due to STDs, reassured patient. Cytology/HSV sent, patient will be contacted with any positive results that require additional treatment. Patient to refrain from sexual activity for the next 7 days. Patient to follow up with urology as scheduled for continued penile discharge. Return precautions given.    Final Clinical Impressions(s) / UC Diagnoses   Final diagnoses:  Rash  Concern about STD in male without diagnosis    New Prescriptions Discharge Medication List as of 06/17/2017  3:23 PM        Belinda Fisher, PA-C 06/17/17 2215

## 2017-06-17 NOTE — Discharge Instructions (Signed)
Negative strep. Cytology and blood work sent, you will be contacted with any positive results that requires further treatment. Refrain from sexual activity for the next 7 days. Follow up with urology as scheduled for further evaluation of symptoms. Your rash/itchiness could be due to dry skin. Apply unscented lotion to help with symptoms. You can take zyrtec to help with itchiness. Monitor for any worsening of symptoms, fever, abdominal pain, nausea, vomiting, to follow up for reevaluation.

## 2017-06-18 ENCOUNTER — Ambulatory Visit: Payer: Medicaid Other | Admitting: Student

## 2017-06-18 LAB — HSV 2 ANTIBODY, IGG

## 2017-06-18 LAB — URINE CYTOLOGY ANCILLARY ONLY
CHLAMYDIA, DNA PROBE: NEGATIVE
Neisseria Gonorrhea: NEGATIVE
Trichomonas: NEGATIVE

## 2017-06-18 LAB — HSV 1 ANTIBODY, IGG

## 2017-06-18 LAB — CYTOLOGY, (ORAL, ANAL, URETHRAL) ANCILLARY ONLY
Chlamydia: NEGATIVE
NEISSERIA GONORRHEA: NEGATIVE
TRICH (WINDOWPATH): NEGATIVE

## 2017-07-04 ENCOUNTER — Encounter (HOSPITAL_COMMUNITY): Payer: Self-pay

## 2017-07-04 DIAGNOSIS — R4702 Dysphasia: Secondary | ICD-10-CM | POA: Diagnosis present

## 2017-07-04 DIAGNOSIS — K209 Esophagitis, unspecified: Secondary | ICD-10-CM | POA: Insufficient documentation

## 2017-07-04 NOTE — ED Triage Notes (Signed)
Pt states that he took a pill last night and went to bed, felt like it didn't go down completely. When he went to work he states he spiked a fever and the body aches started. He does not remember what the medication was but says it was a prescription for breakouts he has been taking for a week.

## 2017-07-05 ENCOUNTER — Emergency Department (HOSPITAL_COMMUNITY)
Admission: EM | Admit: 2017-07-05 | Discharge: 2017-07-05 | Disposition: A | Discharge status: Home / Self Care | Payer: Medicaid Other | Attending: Emergency Medicine | Admitting: Emergency Medicine

## 2017-07-05 DIAGNOSIS — T50905A Adverse effect of unspecified drugs, medicaments and biological substances, initial encounter: Secondary | ICD-10-CM

## 2017-07-05 DIAGNOSIS — K208 Other esophagitis without bleeding: Secondary | ICD-10-CM

## 2017-07-05 MED ORDER — OMEPRAZOLE 20 MG PO CPDR: 20.0000 mg | DELAYED_RELEASE_CAPSULE | Freq: Every day | ORAL | 0 refills | Status: DC

## 2017-07-05 MED ORDER — GI COCKTAIL ~~LOC~~
30.0000 mL | Freq: Once | ORAL | Status: AC
  Administered 2017-07-05: 30 mL via ORAL
  Filled 2017-07-05: qty 30

## 2017-07-05 NOTE — ED Provider Notes (Signed)
[facility]-EMERGENCY DEPT Provider Note   CSN: [id number] Arrival date & time: 07/04/17  2318     History   Chief Complaint Chief Complaint  Patient presents with  . Generalized Body Aches    HPI Christopher Townsend is a 21 y.o. male.  21 year old male presents to the emergency department for evaluation of dysphasia. He states that he has been taking doxycycline and took 1 pill last night before going to bed without a glass of water. He noticed a discomfort in the center of his chest this morning. This is aggravated with eating or drinking. He does not believe that the pill "went all the way down" when taking it last night. He notes being at work and "feeling hot" as well as generally weak. No medications taken for this PTA. No fevers, vomiting, inability to eat or drink. He has been able to tolerate POs today without vomiting or drooling. No SOB.      Past Medical History:  Diagnosis Date  . Anxiety   . Multiple allergies   . Seasonal allergies     Patient Active Problem List   Diagnosis Date Noted  . Keratosis pilaris 05/23/2017  . Heartburn 05/23/2017  . 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 Generalized anxiety disorder 04/22/2014  . Pre-syncope 04/22/2014  . Mild tetrahydrocannabinol (THC) abuse 04/14/2014  . Tinea corporis 02/23/2014  . Weight loss, intentional 11/23/2011    History reviewed. No pertinent surgical history.     Home Medications    Prior to Admission medications   Medication Sig Start Date End Date Taking? Authorizing Provider  doxycycline (VIBRA-TABS) 100 MG tablet Take 100 mg by mouth 2 (two) times daily. 06/18/17   [provider]  omeprazole (PRILOSEC) 20 MG capsule Take 1 capsule (20 mg total) by mouth daily. 07/05/17   Antony Madura, PA-C    Family History Family History  Problem Relation Age of Onset  . Asthma Neg Hx   . Cancer Neg Hx   . Diabetes Neg Hx   . Heart failure Neg Hx   . Hyperlipidemia Neg Hx     Social  History Social History  Substance Use Topics  . Smoking status: Never Smoker  . Smokeless tobacco: Never Used  . Alcohol use No     Allergies   Patient has no known allergies.   Review of Systems Review of Systems Ten systems reviewed and are negative for acute change, except as noted in the HPI.    Physical Exam Updated Vital Signs BP 122/72 (BP Location: Left Arm)   Pulse 67   Temp 99.3 F (37.4 C) (Oral)   Resp 16   Ht 6' (1.829 m)   Wt 74.8 kg (165 lb)   SpO2 100%   BMI 22.38 kg/m   Physical Exam  Constitutional: He is oriented to person, place, and time. He appears well-developed and well-nourished. No distress.  Nontoxic appearing and in NAD  HENT:  Head: Normocephalic and atraumatic.  Patient tolerating secretions without difficulty. No drooling or tripoding.  Eyes: Conjunctivae and EOM are normal. No scleral icterus.  Neck: Normal range of motion.  No meningismus. No stridor.  Cardiovascular: Normal rate, regular rhythm and intact distal pulses.   Pulmonary/Chest: Effort normal. No respiratory distress. He has no wheezes. He has no rales.  Lungs CTAB  Musculoskeletal: Normal range of motion.  Neurological: He is alert and oriented to person, place, and time. He exhibits normal muscle tone. Coordination normal.  GCS 15. Patient moving all extremities.  Skin: Skin is warm and dry. No rash noted. He is not diaphoretic. No erythema. No pallor.  Psychiatric: He has a normal mood and affect. His behavior is normal.  Nursing note and vitals reviewed.    ED Treatments / Results  Labs (all labs ordered are listed, but only abnormal results are displayed) Labs Reviewed - No data to display  EKG  EKG Interpretation None       Radiology No results found.  Procedures Procedures (including critical care time)  Medications Ordered in ED Medications  gi cocktail (Maalox,Lidocaine,Donnatal) (not administered)     Initial Impression / Assessment and  Plan / ED Course  I have reviewed the triage vital signs and the nursing notes.  Pertinent labs & imaging results that were available during my care of the patient were reviewed by me and considered in my medical decision making (see chart for details).     Patient reports dysphasia with onset today after taking a tablet of doxycycline last night without a glass of water. He is nontoxic appearing and afebrile. Vital signs stable. He has been able to and drink today without difficulty, this does cause him some mild discomfort. No tripoding or stridor. GI cocktail ordered in the ED for management. Will discharge on Prilosec. Have advised the patient to take this antibiotic with a full glass of milk or water to prevent symptom recurrence. Return precautions discussed and provided. Patient discharged in stable condition with no unaddressed concerns.   Final Clinical Impressions(s) / ED Diagnoses   Final diagnoses:  Pill esophagitis    New Prescriptions New Prescriptions   OMEPRAZOLE (PRILOSEC) 20 MG CAPSULE    Take 1 capsule (20 mg total) by mouth daily.     Antony Madura, PA-C 07/05/17 0134    Tilden Fossa, MD 07/05/17 [id number]

## 2017-07-08 ENCOUNTER — Encounter (HOSPITAL_COMMUNITY): Payer: Self-pay | Admitting: Emergency Medicine

## 2017-07-08 ENCOUNTER — Emergency Department (HOSPITAL_COMMUNITY)
Admission: EM | Admit: 2017-07-08 | Discharge: 2017-07-09 | Disposition: A | Discharge status: Home / Self Care | Payer: Medicaid Other | Attending: Emergency Medicine | Admitting: Emergency Medicine

## 2017-07-08 DIAGNOSIS — R110 Nausea: Secondary | ICD-10-CM | POA: Diagnosis not present

## 2017-07-08 DIAGNOSIS — R1013 Epigastric pain: Secondary | ICD-10-CM | POA: Diagnosis present

## 2017-07-08 DIAGNOSIS — R1011 Right upper quadrant pain: Secondary | ICD-10-CM | POA: Diagnosis not present

## 2017-07-08 DIAGNOSIS — K2900 Acute gastritis without bleeding: Secondary | ICD-10-CM | POA: Diagnosis not present

## 2017-07-08 LAB — COMPREHENSIVE METABOLIC PANEL
ALBUMIN: 4.7 g/dL (ref 3.5–5.0)
ALT: 13 U/L — ABNORMAL LOW (ref 17–63)
ANION GAP: 13 (ref 5–15)
AST: 21 U/L (ref 15–41)
Alkaline Phosphatase: 63 U/L (ref 38–126)
BILIRUBIN TOTAL: 1 mg/dL (ref 0.3–1.2)
BUN: 24 mg/dL — ABNORMAL HIGH (ref 6–20)
CHLORIDE: 100 mmol/L — AB (ref 101–111)
CO2: 25 mmol/L (ref 22–32)
Calcium: 9.9 mg/dL (ref 8.9–10.3)
Creatinine, Ser: 1.2 mg/dL (ref 0.61–1.24)
GFR calc Af Amer: >60 mL/min (ref >60)
GFR calc non Af Amer: >60 mL/min (ref >60)
GLUCOSE: 66 mg/dL (ref 65–99)
POTASSIUM: 4.3 mmol/L (ref 3.5–5.1)
SODIUM: 138 mmol/L (ref 135–145)
TOTAL PROTEIN: 8.6 g/dL — AB (ref 6.5–8.1)

## 2017-07-08 LAB — CBC
HEMATOCRIT: 47.4 % (ref 39.0–52.0)
HEMOGLOBIN: 16.4 g/dL (ref 13.0–17.0)
MCH: 27.3 pg (ref 26.0–34.0)
MCHC: 34.6 g/dL (ref 30.0–36.0)
MCV: 79 fL (ref 78.0–100.0)
Platelets: 251 10*3/uL (ref 150–400)
RBC: 6 MIL/uL — ABNORMAL HIGH (ref 4.22–5.81)
RDW: 13.7 % (ref 11.5–15.5)
WBC: 6 10*3/uL (ref 4.0–10.5)

## 2017-07-08 LAB — URINALYSIS, ROUTINE W REFLEX MICROSCOPIC
BACTERIA UA: NONE SEEN
Bilirubin Urine: NEGATIVE
GLUCOSE, UA: NEGATIVE mg/dL
Hgb urine dipstick: NEGATIVE
KETONES UR: 80 mg/dL — AB
LEUKOCYTES UA: NEGATIVE
NITRITE: NEGATIVE
PH: 5 (ref 5.0–8.0)
Protein, ur: 30 mg/dL — AB
SPECIFIC GRAVITY, URINE: 1.03 (ref 1.005–1.030)

## 2017-07-08 LAB — LIPASE, BLOOD: LIPASE: 26 U/L (ref 11–51)

## 2017-07-08 MED ORDER — FAMOTIDINE IN NACL 20-0.9 MG/50ML-% IV SOLN
20.0000 mg | Freq: Once | INTRAVENOUS | Status: AC
  Administered 2017-07-09: 20 mg via INTRAVENOUS
  Filled 2017-07-08: qty 50

## 2017-07-08 MED ORDER — SODIUM CHLORIDE 0.9 % IV BOLUS (SEPSIS)
1000.0000 mL | Freq: Once | INTRAVENOUS | Status: AC
  Administered 2017-07-09: 1000 mL via INTRAVENOUS

## 2017-07-08 MED ORDER — GI COCKTAIL ~~LOC~~
30.0000 mL | Freq: Once | ORAL | Status: AC
  Administered 2017-07-09: 30 mL via ORAL
  Filled 2017-07-08: qty 30

## 2017-07-08 MED ORDER — SUCRALFATE 1 G PO TABS
1.0000 g | ORAL_TABLET | Freq: Once | ORAL | Status: AC
  Administered 2017-07-09: 1 g via ORAL
  Filled 2017-07-08: qty 1

## 2017-07-08 MED ORDER — METOCLOPRAMIDE HCL 5 MG/ML IJ SOLN
10.0000 mg | Freq: Once | INTRAMUSCULAR | Status: AC
  Administered 2017-07-09: 10 mg via INTRAVENOUS
  Filled 2017-07-08: qty 2

## 2017-07-08 NOTE — ED Triage Notes (Signed)
Pt complaint of generalized abdominal pain worse with eating; associated nausea; onset Tuesday.

## 2017-07-09 ENCOUNTER — Emergency Department (HOSPITAL_COMMUNITY): Payer: Medicaid Other

## 2017-07-09 MED ORDER — RANITIDINE HCL 150 MG PO TABS: 150.0000 mg | ORAL_TABLET | Freq: Two times a day (BID) | ORAL | 0 refills | Status: DC

## 2017-07-09 MED ORDER — SUCRALFATE 1 GM/10ML PO SUSP: 1.0000 g | Freq: Three times a day (TID) | ORAL | 0 refills | Status: DC

## 2017-07-09 NOTE — ED Provider Notes (Signed)
[facility]-EMERGENCY DEPT Provider Note   CSN: [id number] Arrival date & time: 07/08/17  1826     History   Chief Complaint Chief Complaint  Patient presents with  . Abdominal Pain    HPI Christopher Townsend is a 21 y.o. male.  HPI 21 year old African-American male with no significant past medical history presents to the emergency department today with complaints of epigastric and right upper quadrant abdominal pain. Patient reports associated nausea but denies any emesis. Patient states the pain is worse with eating.States that this pain started after swallowing a doxycycline pill several days ago. Patient was seen in the ED following that with a normal workup. He was started on acid reflux medication and told to follow-up with a GI doctor. Has not done so. The patient states that the omeprazole is not working. States that when he eats or drinks this causes the pain.He denies any associated changes in his bowel movements or blood in his stool. Denies any urinary symptoms. Patient has had no episode of emesis. Denies any chronic NSAID use. Denies any recent travel outside the [place]. Denies any bloating or early satiety.   Pt denies any fever, chill, ha, vision changes, lightheadedness, dizziness, congestion, neck pain, cp, sob, cough, , urinary symptoms, change in bowel habits, melena, hematochezia, lower extremity paresthesias.  Past Medical History:  Diagnosis Date  . Anxiety   . Multiple allergies   . Seasonal allergies     Patient Active Problem List   Diagnosis Date Noted  . Keratosis pilaris 05/23/2017  . Heartburn 05/23/2017  . 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 Generalized anxiety disorder 04/22/2014  . Pre-syncope 04/22/2014  . Mild tetrahydrocannabinol (THC) abuse 04/14/2014  . Tinea corporis 02/23/2014  . Weight loss, intentional 11/23/2011    History reviewed. No pertinent surgical history.     Home Medications    Prior to Admission  medications   Medication Sig Start Date End Date Taking? Authorizing Provider  omeprazole (PRILOSEC) 20 MG capsule Take 1 capsule (20 mg total) by mouth daily. 07/05/17  Yes Antony Madura, PA-C  ranitidine (ZANTAC) 150 MG tablet Take 1 tablet (150 mg total) by mouth 2 (two) times daily. 07/09/17   Rise Mu, PA-C  sucralfate (CARAFATE) 1 GM/10ML suspension Take 10 mLs (1 g total) by mouth 4 (four) times daily -  with meals and at bedtime. 07/09/17   Rise Mu, PA-C    Family History Family History  Problem Relation Age of Onset  . Asthma Neg Hx   . Cancer Neg Hx   . Diabetes Neg Hx   . Heart failure Neg Hx   . Hyperlipidemia Neg Hx     Social History Social History  Substance Use Topics  . Smoking status: Never Smoker  . Smokeless tobacco: Never Used  . Alcohol use No     Allergies   Patient has no known allergies.   Review of Systems Review of Systems  Constitutional: Negative for chills and fever.  HENT: Negative for congestion and sore throat.   Eyes: Negative for visual disturbance.  Respiratory: Negative for cough and shortness of breath.   Cardiovascular: Negative for chest pain.  Gastrointestinal: Positive for abdominal pain and nausea. Negative for blood in stool, constipation, diarrhea and vomiting.  Genitourinary: Negative for dysuria, flank pain, frequency, hematuria and urgency.  Musculoskeletal: Negative for arthralgias and myalgias.  Skin: Negative for rash.  Neurological: Negative for dizziness, syncope, weakness, light-headedness, numbness and headaches.  Psychiatric/Behavioral: Negative for sleep  disturbance. The patient is not nervous/anxious.      Physical Exam Updated Vital Signs BP (!) 117/55 (BP Location: Right Arm)   Pulse (!) 57   Temp 98.4 F (36.9 C) (Oral)   Resp 16   Ht 6' (1.829 m)   Wt 74.8 kg (165 lb)   SpO2 99%   BMI 22.38 kg/m   Physical Exam  Constitutional: He is oriented to person, place, and time. He  appears well-developed and well-nourished.  Non-toxic appearance. No distress.  HENT:  Head: Normocephalic and atraumatic.  Mouth/Throat: Oropharynx is clear and moist.  Eyes: Pupils are equal, round, and reactive to light. Conjunctivae are normal. Right eye exhibits no discharge. Left eye exhibits no discharge.  Neck: Normal range of motion. Neck supple.  Cardiovascular: Normal rate, regular rhythm, normal heart sounds and intact distal pulses.  Exam reveals no gallop and no friction rub.   No murmur heard. Pulmonary/Chest: Effort normal and breath sounds normal. No respiratory distress. He has no wheezes. He has no rales. He exhibits no tenderness.  Abdominal: Soft. Bowel sounds are normal. He exhibits no distension. There is tenderness in the right upper quadrant and epigastric area. There is no rigidity, no rebound, no guarding, no CVA tenderness, no tenderness at McBurney's point and negative Murphy's sign.  Musculoskeletal: Normal range of motion. He exhibits no tenderness.  Lymphadenopathy:    He has no cervical adenopathy.  Neurological: He is alert and oriented to person, place, and time.  Skin: Skin is warm and dry. Capillary refill takes less than 2 seconds. No rash noted.  Psychiatric: His behavior is normal. Judgment and thought content normal.  Nursing note and vitals reviewed.    ED Treatments / Results  Labs (all labs ordered are listed, but only abnormal results are displayed) Labs Reviewed  COMPREHENSIVE METABOLIC PANEL - Abnormal; Notable for the following:       Result Value   Chloride 100 (*)    BUN 24 (*)    Total Protein 8.6 (*)    ALT 13 (*)    All other components within normal limits  CBC - Abnormal; Notable for the following:    RBC 6.00 (*)    All other components within normal limits  URINALYSIS, ROUTINE W REFLEX MICROSCOPIC - Abnormal; Notable for the following:    Ketones, ur 80 (*)    Protein, ur 30 (*)    Squamous Epithelial / LPF 0-5 (*)    All  other components within normal limits  LIPASE, BLOOD    EKG  EKG Interpretation None       Radiology US Abdomen Complete  Result Date: 07/09/2017 CLINICAL DATA:  Initial evaluation for acute epigastric pain for 1 day. EXAM: ABDOMEN ULTRASOUND COMPLETE COMPARISON:  None. FINDINGS: Gallbladder: No gallstones or wall thickening visualized. No sonographic Murphy sign noted by sonographer. Common bile duct: Diameter: 3.4 mm Liver: No focal lesion identified. Within normal limits in parenchymal echogenicity. Portal vein is patent on color Doppler imaging with normal direction of blood flow towards the liver. IVC: No abnormality visualized. Pancreas: Visualized portion unremarkable. Spleen: Size and appearance within normal limits. Right Kidney: Length: 9.9 cm. Echogenicity within normal limits. No mass or hydronephrosis visualized. Left Kidney: Length: 10.2 cm. Echogenicity within normal limits. No mass or hydronephrosis visualized. Abdominal aorta: No aneurysm visualized. Other findings: None. IMPRESSION: Normal abdominal ultrasound.  No acute abnormality identified. Electronically Signed   By: Rise Mu M.D.   On: 07/09/2017 01:51  Procedures Procedures (including critical care time)  Medications Ordered in ED Medications  sucralfate (CARAFATE) tablet 1 g (1 g Oral Given 07/09/17 0014)  gi cocktail (Maalox,Lidocaine,Donnatal) (30 mLs Oral Given 07/09/17 0014)  sodium chloride 0.9 % bolus 1,000 mL (0 mLs Intravenous Stopped 07/09/17 0215)  metoCLOPramide (REGLAN) injection 10 mg (10 mg Intravenous Given 07/09/17 0021)  famotidine (PEPCID) IVPB 20 mg premix (0 mg Intravenous Stopped 07/09/17 0215)     Initial Impression / Assessment and Plan / ED Course  I have reviewed the triage vital signs and the nursing notes.  Pertinent labs & imaging results that were available during my care of the patient were reviewed by me and considered in my medical decision making (see chart for  details).     Patient presents to the ED with complaints of epigastric and right upper quadrant abdominal pain with associated nausea. This has been ongoing since he took a doxycycline pill several days ago. Patient seen in ED following that with normal workup and given omeprazole.patient denies any associated emesis, urinary symptoms, fever, change in bowel habits, melena, hematochezia.  Patient is overall well-appearing and nontoxic. Vital signs are very reassuring.  On exam patient has no signs of peritonitis. Does have some mild epigastric and right upper quadrant discomfort. Lungs clear to auscultation bilaterally.  Lab work is very reassuring. No leukocytosis. Lipase is normal. Liver enzymes are normal. Creatinine is normal. UA with protein and ketones but no signs of infection.  Patient states he has had poor by mouth intake due to the pain.   Given the right upper quadrant abdominal pain ultrasound was ordered. This showed no acute abnormalities. For patient's symptoms likely due to gastritis. Patient given GI cocktail, Carafate, nausea medication and Pepcid in the ED with significant improvement in his symptoms. Tolerating by mouth fluid and food.  Low suspicion for pancreatitis given normal lipase.for appendicitis.   Patient will be discharged home on Zantac, Carafate and Prilosec.Encourage bland diet. Encouraged follow-up with GI doctor if symptoms do not improve.   Pt is hemodynamically stable, in NAD, & able to ambulate in the ED. Evaluation does not show pathology that would require ongoing emergent intervention or inpatient treatment. I explained the diagnosis to the patient. Pain has been managed & has no complaints prior to dc. Pt is comfortable with above plan and is stable for discharge at this time. All questions were answered prior to disposition. Strict return precautions for f/u to the ED were discussed. Encouraged follow up with PCP.  Final Clinical Impressions(s) / ED  Diagnoses   Final diagnoses:  Acute gastritis without hemorrhage, unspecified gastritis type    New Prescriptions Discharge Medication List as of 07/09/2017  2:21 AM    START taking these medications   Details  ranitidine (ZANTAC) 150 MG tablet Take 1 tablet (150 mg total) by mouth 2 (two) times daily., Starting Tue 07/09/2017, Print    sucralfate (CARAFATE) 1 GM/10ML suspension Take 10 mLs (1 g total) by mouth 4 (four) times daily -  with meals and at bedtime., Starting Tue 07/09/2017, Print         Rise Mu, PA-C 07/09/17 0603    Ward, Layla Maw, DO 07/09/17 [phone]

## 2017-07-09 NOTE — Discharge Instructions (Signed)
Your abdominal pain is likely from gastritis, reflux or a stomach ulcer. You will need to take the prescribed proton pump inhibitor as directed, and avoid spicy/fatty/acidic foods. Avoid laying down flat within 30 minutes of eating. Avoid NSAIDs like ibuprofen or Aleve on an empty stomach. Follow up with the gastroenterologist (GI doctor) listed for ongoing evaluation of your abdominal pain. Return to the ER for new or worsening symptoms, any additional concers.  Take the Prilosec that he were given before. May take Zantac as well. Take Carafate as prescribed. It is important that she follow-up with a GI doctor.   SEEK IMMEDIATE MEDICAL ATTENTION IF YOU DEVELOP ANY OF THE FOLLOWING SYMPTOMS: The pain does not go away or becomes severe.  A temperature above 101 develops.  Repeated vomiting occurs (multiple episodes).  Blood is being passed in stools or vomit (bright red or black tarry stools).  Return also if you develop chest pain, difficulty breathing, dizziness or fainting

## 2017-07-31 ENCOUNTER — Ambulatory Visit: Payer: Medicaid Other | Admitting: Student

## 2017-08-05 ENCOUNTER — Ambulatory Visit: Payer: Medicaid Other | Admitting: Student

## 2017-10-11 ENCOUNTER — Ambulatory Visit (INDEPENDENT_AMBULATORY_CARE_PROVIDER_SITE_OTHER): Payer: Self-pay | Admitting: Internal Medicine

## 2017-10-11 ENCOUNTER — Encounter: Payer: Self-pay | Admitting: Internal Medicine

## 2017-10-11 ENCOUNTER — Other Ambulatory Visit (HOSPITAL_COMMUNITY)
Admission: RE | Admit: 2017-10-11 | Discharge: 2017-10-11 | Disposition: A | Discharge status: Home / Self Care | Payer: Self-pay | Source: Ambulatory Visit | Attending: Family Medicine | Admitting: Family Medicine

## 2017-10-11 ENCOUNTER — Other Ambulatory Visit: Payer: Self-pay

## 2017-10-11 VITALS — BP 112/68 | HR 72 | Temp 98.3°F | Ht 72.0 in | Wt 166.0 lb

## 2017-10-11 DIAGNOSIS — R369 Urethral discharge, unspecified: Secondary | ICD-10-CM | POA: Insufficient documentation

## 2017-10-11 DIAGNOSIS — B354 Tinea corporis: Secondary | ICD-10-CM

## 2017-10-11 LAB — POCT WET PREP (WET MOUNT): Trichomonas Wet Prep HPF POC: ABSENT

## 2017-10-11 MED ORDER — TERBINAFINE HCL 250 MG PO TABS: 250.0000 mg | ORAL_TABLET | Freq: Every day | ORAL | 0 refills | Status: DC

## 2017-10-11 NOTE — Patient Instructions (Signed)
It was so nice to meet you!  We have ordered some more labs for you today and I will call you with these results.  I do think your back looks like you have a fungal infection. I have prescribed an anti-fungal called Terbinafine. Please take 1 tablet daily for 2 weeks.  -Dr. Nancy MarusMayo

## 2017-10-12 DIAGNOSIS — R369 Urethral discharge, unspecified: Secondary | ICD-10-CM | POA: Insufficient documentation

## 2017-10-12 NOTE — Assessment & Plan Note (Signed)
Appearance of rash on back consistent with tinea corporis. May also have an early fungal infection on the chest. Given extensive area, will treat with Terbinafine 250mg  PO x 14 days. Follow-up if no improvement.

## 2017-10-12 NOTE — Progress Notes (Signed)
   [facility] Phone: [phone]  Subjective:  Christopher OhmChris is a 22 year old male presenting to clinic with rash and penile discharge.  Rash: Occurring on his chest, back, and genital area. Has been going on for the last 4 months. Rash is itchy. Patient feels like rash has been spreading. Started out on his chest. He has not put anything on the rash.  Penile Discharge: Has been going on for years. Discharge is clear, but sometimes the discharge is thick and white. Occurs almost every day. Nothing brings the discharge on. Not currently sexually active, but has been sexually active in the past. Has a history of gonorrhea and chlamydia and was treated for both. Also notes some skin burning around his urethra that occurs a couple days per week. Denies any abdominal pain, fevers, dysuria, urinary frequency, urinary urgency, or penile lesions. Has had multiple STD tests recently that have all been negative.  ROS: See HPI for pertinent positives and negatives  Past Medical History- GAD  Family history reviewed for today's visit. No changes.  Social history- patient is a never smoker. Occasional marijuana use.  Objective: BP 112/68   Pulse 72   Temp 98.3 F (36.8 C) (Oral)   Ht 6' (1.829 m)   Wt 166 lb (75.3 kg)   BMI 22.51 kg/m  Gen: NAD, alert, cooperative with exam GU: Genitalia normal in appearance, no skin lesions or skin breakdown, no penile discharge present Skin: Multiple areas of scaly patches of skin with hyperpigmented borders present on the back, patches of dry flaky skin present on the chest.  Assessment/Plan: Tinea Corporis: Appearance of rash on back consistent with tinea corporis. May also have an early fungal infection on the chest. Given extensive area, will treat with Terbinafine 250mg  PO x 14 days. Follow-up if no improvement.  Penile Discharge: Unclear etiology. Has had negative STD testing on multiple occasions recently. He notes that the  discharge is occasionally thick and white so may be a yeast infection, although this seems quite unusual for a healthy 22 year old male. I wonder if this could also be pre-ejaculate. Will order urine gonorrhea, chlamydia, trichomonas, and candida. No genital lesions or signs of balanitis appreciated. Follow-up if no improvement.   Willadean CarolKaty Johney Perotti, MD PGY-3

## 2017-10-12 NOTE — Assessment & Plan Note (Signed)
Unclear etiology. Has had negative STD testing on multiple occasions recently. He notes that the discharge is occasionally thick and white so may be a yeast infection, although this seems quite unusual for a healthy 22 year old male. I wonder if this could also be pre-ejaculate. Will order urine gonorrhea, chlamydia, trichomonas, and candida. No genital lesions or signs of balanitis appreciated. Follow-up if no improvement.

## 2017-10-14 LAB — URINE CYTOLOGY ANCILLARY ONLY
CHLAMYDIA, DNA PROBE: POSITIVE — AB
NEISSERIA GONORRHEA: NEGATIVE
Trichomonas: NEGATIVE

## 2017-10-16 ENCOUNTER — Ambulatory Visit (INDEPENDENT_AMBULATORY_CARE_PROVIDER_SITE_OTHER): Payer: Self-pay

## 2017-10-16 DIAGNOSIS — A749 Chlamydial infection, unspecified: Secondary | ICD-10-CM

## 2017-10-16 LAB — URINE CYTOLOGY ANCILLARY ONLY
Bacterial vaginitis: NEGATIVE
CANDIDA VAGINITIS: NEGATIVE

## 2017-10-16 MED ORDER — AZITHROMYCIN 500 MG PO TABS
1000.0000 mg | ORAL_TABLET | Freq: Once | ORAL | Status: AC
  Administered 2017-10-16: 1000 mg via ORAL

## 2017-10-16 NOTE — Patient Instructions (Signed)
How to Use a Condom Correctly, Adult Using a condom correctly and consistently is important for preventing pregnancy and the spread of sexually transmitted diseases (STDs). Condoms work by blocking contact with bodily fluids that can result in pregnancy or spread infection. This is called the barrier method. What are the different types of condoms? There are both male and male condoms. A male condom is a thin sheath that fits over an erect penis. Male condoms can be made from different materials, including:  Latex.  Polyurethane. This is a type of plastic.  Synthetic rubber.  Lambskin or other natural membranes.  Male condoms can be lubricated or unlubricated. A male condom is a thin pouch inserted into the vagina. An inner ring holds the condom in place. Another ring covers the outer folds of skin (labia). Male condoms are made from a rubber-like substance (nitrile). What do condoms prevent? Condoms can effectively prevent:  Pregnancy.  STDs that are transmitted through genital fluids. These include: ? HIV and AIDS. ? Gonorrhea. ? Chlamydia. ? Hepatitis B and C.  Condoms also offer some protection from STDs that are transmitted through skin-to-skin contact if the infected area is covered by the condom. These infections include:  Syphilis.  Genital herpes.  Human papillomavirus (HPV).  Trichomoniasis.  Condoms made from lambskin or other natural membranes are not as effective at preventing STDs because some germs can pass through them. How do I use a condom? Male condom  Store condoms in a cool, dry place.  Before using a condom: ? Check the package to make sure the expiration date has not passed. ? Make sure that both the package and the condom do not have any holes, rips, or tears in them. ? Make sure that the condom is not brittle or discolored.  Make sure the condom is ready to be put on the right way. It should be ready to unroll downward.  Place the condom  over your erect penis before engaging in any contact with your partner's mouth, anus, or vagina.  Pinch the tip of the condomwhile rolling it down to the base of the penis so that the entire penis is covered.  You can use water-based or silicone-based lubricants with male condoms. Do not use oil-based lubricants.  After you ejaculate, hold the rim of the condom as you withdraw.  Carefully pull off the condom away from your partner and be careful to avoid spills.  Wrap the condom in tissue or toilet paper and discard it in a trash can. Do not flush it down the toilet.  Do not use the same condom more than once. Use a new condom every time you have sex.  Male condom  Store condoms in a cool, dry place.  Before using a condom: ? Check the package to make sure the expiration date has not passed. ? Make sure that both the package and the condom do not have any holes, rips, or tears in them. ? Make sure that the condom is not brittle or discolored.  Place the condom inside your vagina before engaging in any contact with your partner's penis. To do this: ? Squeeze the inner ring and insert it into the vagina like a tampon. ? Use your index finger to push it into place. There should be about one inch of condom outside of the vagina to expand during sex. ? Make sure the outer part of the condom completely covers the labia.  Male condoms are already lubricated. You can also use   water-based or silicone-based lubricants with male condoms. Do not use oil-based lubricants.  Your partner should withdraw his penis shortly after ejaculating. Before you stand up, grasp the condom. Twist the outer part slightly to hold in fluid and carefully remove it.  Your partner can also grasp the condom and remove it at the same time he withdraws his penis.  Wrap the condom in tissue or toilet paper and discard it in a trash can. Do not flush it down the toilet.  Do not use the same condom more than  once. Use a new condom every time you have sex.  Where can I get more information? Learn more about how to use a condom correctly from:  Centers for Disease Control and Prevention: [employer].com.pthttp://www.cdc.gov/condomeffectiveness/male-condom-use.html  http://jones-harris.biz/AIDS.gov: https://craig.com/https://www.aids.gov/hiv-aids-basics/prevention/reduce-your-risk/using-condoms/  Planned Parenthood: https://www.plannedparenthood.org/learn/birth-control/condom/how-to-put-a-condom-on  This information is not intended to replace advice given to you by your health care provider. Make sure you discuss any questions you have with your health care provider. Document Released: 10/07/2015 Document Revised: 02/16/2016 Document Reviewed: 05/23/2016 Elsevier Interactive Patient Education  2018 [employer] Chlamydia, Male Chlamydia is an STD (sexually transmitted disease). It is a bacterial infection that spreads through sexual contact (is contagious). Chlamydia can occur in different areas of the body, including the tube that moves urine from the bladder out of the body (urethra), the throat, or the rectum. This condition is not difficult to treat. However, if left untreated, chlamydia can lead to more serious health problems. What are the causes? Chlamydia is caused by the bacteria Chlamydia trachomatis. It is passed from an infected partner during sexual activity. Chlamydia can spread through contact with the genitals, mouth, or rectum. What are the signs or symptoms? In some cases, there may not be any symptoms for this condition (asymptomatic), especially early in the infection. If symptoms develop, they may include:  Burning when urinating.  Urinating frequently.  Pain or swelling in the testicles.  Watery, mucus-like discharge from the penis.  Redness, soreness, and swelling (inflammation) of the rectum.  Bleeding or discharge from the rectum.  Abdominal pain.  Itching, burning, or redness in the eyes, or discharge from the  eyes.  How is this diagnosed? This condition may be diagnosed based on:  Urine tests.  Swab tests. Depending on your symptoms, your health care provider may use a cotton swab to collect discharge from your urethra or rectum to test for the bacteria.  How is this treated? This condition is treated with antibiotic medicines. Follow these instructions at home: Medicines  Take over-the-counter and prescription medicines only as told by your health care provider.  Take your antibiotic medicine as told by your health care provider. Do not stop taking the antibiotic even if you start to feel better. Sexual activity  Tell sexual partners about your infection. This includes any oral, anal, or vaginal sex partners you have had within 60 days of when your symptoms started. Sexual partners should also be treated, even if they have no signs of the disease.  Do not have sex until you and your sexual partners have completed treatment and your health care provider says it is okay. If your health care provider prescribed you a single dose treatment, wait 7 days after taking the treatment before having sex. General instructions  It is your responsibility to get your test results. Ask your health care provider, or the department performing the test, when your results will be ready.  Get plenty of rest.  Eat a healthy, well-balanced diet.  Drink enough  fluids to keep your urine clear or pale yellow.  Keep all follow-up visits as told by your health care provider. This is important. You may need to be tested for infection again 3 months after treatment. How is this prevented? The only sure way to prevent chlamydia is to avoid sexual intercourse. However, you can lower your risk by:  Using latex condoms correctly every time you have sexual intercourse.  Not having multiple sexual partners.  Asking if your sexual partner has been tested for STIs and had negative results.  Contact a health care  provider if:  You develop new symptoms or your symptoms do not get better after completing treatment.  You have a fever or chills.  You have pain during sexual intercourse.  You develop new joint pain or swelling near your joints.  You have pain or soreness in your testicles. Get help right away if:  Your pain gets worse and does not get better with medicine.  You have abnormal discharge.  You develop flu-like symptoms, such as night sweats, sore throat, or muscle aches. Summary  Chlamydia is an STD (sexually transmitted disease). It is a bacterial infection that spreads (is contagious) through sexual contact.  This condition is not difficult to treat, however, if left untreated, it can lead to more serious health problems.  In some cases, there may not be any symptoms for this condition (asymptomatic).  This condition is treated with antibiotic medicines.  Using latex condoms correctly every time you have sexual intercourse can help prevent chlamydia. This information is not intended to replace advice given to you by your health care provider. Make sure you discuss any questions you have with your health care provider. Document Released: 09/10/2005 Document Revised: 08/27/2016 Document Reviewed: 08/27/2016 Elsevier Interactive Patient Education  [employer].

## 2017-10-16 NOTE — Progress Notes (Signed)
     Patient in to nurse clinic for treatment of Chlamydia. Administered 1 gram of Azithromycin po per Dr. Nancy MarusMayo. Patient educated on condom use and to avoid sexual activity for 7 - 10 days following treatment and for 7-10 days following partners' treatment. Condoms given with instructions. CD report [facility] Ples SpecterAlisa Natan Hartog, RN [facility] RN)

## 2018-01-07 ENCOUNTER — Ambulatory Visit (INDEPENDENT_AMBULATORY_CARE_PROVIDER_SITE_OTHER): Payer: Self-pay | Admitting: Student

## 2018-01-07 ENCOUNTER — Encounter: Payer: Self-pay | Admitting: Student

## 2018-01-07 ENCOUNTER — Other Ambulatory Visit (HOSPITAL_COMMUNITY)
Admission: RE | Admit: 2018-01-07 | Discharge: 2018-01-07 | Disposition: A | Discharge status: Home / Self Care | Payer: Self-pay | Source: Ambulatory Visit | Attending: Family Medicine | Admitting: Family Medicine

## 2018-01-07 ENCOUNTER — Other Ambulatory Visit: Payer: Self-pay

## 2018-01-07 VITALS — BP 102/64 | HR 55 | Temp 98.1°F | Ht 72.0 in | Wt 162.8 lb

## 2018-01-07 DIAGNOSIS — R369 Urethral discharge, unspecified: Secondary | ICD-10-CM | POA: Insufficient documentation

## 2018-01-07 DIAGNOSIS — Z7251 High risk heterosexual behavior: Secondary | ICD-10-CM

## 2018-01-07 DIAGNOSIS — B360 Pityriasis versicolor: Secondary | ICD-10-CM | POA: Insufficient documentation

## 2018-01-07 LAB — POCT SKIN KOH: SKIN KOH, POC: POSITIVE — AB

## 2018-01-07 MED ORDER — KETOCONAZOLE 2 % EX SHAM: 1.0000 "application " | MEDICATED_SHAMPOO | CUTANEOUS | 0 refills | Status: DC

## 2018-01-07 MED ORDER — CETIRIZINE HCL 10 MG PO TABS: 10.0000 mg | ORAL_TABLET | Freq: Every day | ORAL | 11 refills | Status: DC

## 2018-01-07 NOTE — Progress Notes (Signed)
Subjective:    Christopher Townsend is a 22 y.o. old male here penile discharge and skin rash  HPI Penile discharge: this is a chronic issue. He was recently treating for Chlamydia. Not sure if his partner was treated or not. Repots occasional condom use. One male partner in the last 12 months. Denies oral sex. Discharge is thick and clear. Reports intermittent dysuria. Denis hematuria. Denies fever or chills. Reports intermittent swelling swelling in scrotum.    Skin rash: this is also a chronic issue. Reports itching. Didn't try medication. Denies recent illness, new medicine, food, detergent, soap. No family member with similar rash. Rash is all over. Rash is always there. No progression. No aggravating factor or alleviating factor.  PMH/Problem List: has Weight loss, intentional; Tinea corporis; Mild tetrahydrocannabinol (THC) abuse; Generalized anxiety disorder; Pre-syncope; Pearly penile papules; Keratosis pilaris; Heartburn; and Penile discharge on their problem list.   has a past medical history of Anxiety, Multiple allergies, and Seasonal allergies.  FH:  Family History  Problem Relation Age of Onset  . Asthma Neg Hx   . Cancer Neg Hx   . Diabetes Neg Hx   . Heart failure Neg Hx   . Hyperlipidemia Neg Hx     SH Social History   Tobacco Use  . Smoking status: Never Smoker  . Smokeless tobacco: Never Used  Substance Use Topics  . Alcohol use: No  . Drug use: Yes    Types: Marijuana    Comment: Hx marijuana use    Review of Systems Review of systems negative except for pertinent positives and negatives in history of present illness above.     Objective:     Vitals:   01/07/18 1035  BP: 102/64  Pulse: (!) 55  Temp: 98.1 F (36.7 C)  TempSrc: Oral  SpO2: 97%  Weight: 162 lb 12.8 oz (73.8 kg)  Height: 6' (1.829 m)   Body mass index is 22.08 kg/m.  Physical Exam  GEN: appears well & comfortable. No apparent distress. Head: normocephalic and atraumatic    Oropharynx: MMM. No erythema. No exudation or petechiae.  Uvula midline HEM: negative for cervical or periauricular lymphadenopathies CVS: RRR, nl s1 & s2, no murmurs, no edema RESP: no IWOB, good air movement bilaterally, CTAB GI: BS present & normal, soft, NTND GU: no suprapubic or CVA tenderness.  Circumcised.  No apparent lesion or erythema.  No apparent penile discharge.  No inguinal lymphadenopathy.  No scrotal mass or tenderness.  No inguinal hernia bilaterally. MSK: no focal tenderness or notable swelling SKIN: Diffuse widespread scaly skin lesion over his back, chest, abdomen and his arms NEURO: alert and oiented appropriately, no gross deficits   PSYCH: Flat affect    Assessment and Plan:  1. Penile discharge: patient recently treated for chlamydia about 3 months ago.  Not sure if his partner was treated on not.  Occasionally uses condoms.  GU exam within normal limits.  We will test for HIV, RPR, GC/CT/trichomoniasis. - HIV antibody - RPR - Urine cytology ancillary only  2. Rash and nonspecific skin eruption: exam with diffuse widespread scaly skin lesion over his back, chest abdomen and his arms consistent with tinea versicolor rash. KOH positive - ketoconazole (NIZORAL) 2 % shampoo; Apply 1 application topically 2 (two) times a week.  Dispense: 120 mL; Refill: 0 - cetirizine (ZYRTEC) 10 MG tablet; Take 1 tablet (10 mg total) by mouth daily.  Dispense: 30 tablet; Refill: 11 -Recommended washing his clothes and bed linen with warm water  and good detergents regularly.  Return if symptoms worsen or fail to improve.  Almon Herculesaye T Theordore Cisnero, MD 01/07/18 Pager: [phone]

## 2018-01-07 NOTE — Patient Instructions (Addendum)
It was great seeing you today! We have addressed the following issues today  Penile discharge: We have tested you for gonorrhea/chlamydia/trichomonas/HIV and syphilis.  We will let you know when we have the results back.  It is very important that you use condom consistently.  Also very important that your partner is treated.  Skin rash: We send a prescription for antifungal shampoo to your pharmacy.  Please pick up this prescription and start using.  We also sent a prescription for Zyrtec to your pharmacy for itching.  It is very important that you wash your clothes and bed linen with a good detergent or warm water regularly.  If we did any lab work today, and the results require attention, either me or my nurse will get in touch with you. If everything is normal, you will get a letter in mail and a message via . If you don't hear from [place] in two weeks, please give [place] a call. Otherwise, we look forward to seeing you again at your next visit. If you have any questions or concerns before then, please call the clinic at [phone].  Please bring all your medications to every doctors visit  Sign up for My Chart to have easy access to your labs results, and communication with your Primary care physician.    Please check-out at the front desk before leaving the clinic.    Take Care,   Dr. Alanda Slim  Sexually Transmitted Disease A sexually transmitted disease (STD) is a disease or infection that may be passed (transmitted) from person to person, usually during sexual activity. This may happen by way of saliva, semen, blood, vaginal mucus, or urine. Common STDs include:  Gonorrhea.  Chlamydia.  Syphilis.  HIV and AIDS.  Genital herpes.  Hepatitis B and C.  Trichomonas.  Human papillomavirus (HPV).  Pubic lice.  Scabies.  Mites.  Bacterial vaginosis.  What are the causes? An STD may be caused by bacteria, a virus, or parasites. STDs are often transmitted during sexual activity  if one person is infected. However, they may also be transmitted through nonsexual means. STDs may be transmitted after:  Sexual intercourse with an infected person.  Sharing sex toys with an infected person.  Sharing needles with an infected person or using unclean piercing or tattoo needles.  Having intimate contact with the genitals, mouth, or rectal areas of an infected person.  Exposure to infected fluids during birth.  What are the signs or symptoms? Different STDs have different symptoms. Some people may not have any symptoms. If symptoms are present, they may include:  Painful or bloody urination.  Pain in the pelvis, abdomen, vagina, anus, throat, or eyes.  A skin rash, itching, or irritation.  Growths, ulcerations, blisters, or sores in the genital and anal areas.  Abnormal vaginal discharge with or without bad odor.  Penile discharge in men.  Fever.  Pain or bleeding during sexual intercourse.  Swollen glands in the groin area.  Yellow skin and eyes (jaundice). This is seen with hepatitis.  Swollen testicles.  Infertility.  Sores and blisters in the mouth.  How is this diagnosed? To make a diagnosis, your health care provider may:  Take a medical history.  Perform a physical exam.  Take a sample of any discharge to examine.  Swab the throat, cervix, opening to the penis, rectum, or vagina for testing.  Test a sample of your first morning urine.  Perform blood tests.  Perform a Pap test, if this applies.  Perform a colposcopy.  Perform a laparoscopy.  How is this treated? Treatment depends on the STD. Some STDs may be treated but not cured.  Chlamydia, gonorrhea, trichomonas, and syphilis can be cured with antibiotic medicine.  Genital herpes, hepatitis, and HIV can be treated, but not cured, with prescribed medicines. The medicines lessen symptoms.  Genital warts from HPV can be treated with medicine or by freezing, burning  (electrocautery), or surgery. Warts may come back.  HPV cannot be cured with medicine or surgery. However, abnormal areas may be removed from the cervix, vagina, or vulva.  If your diagnosis is confirmed, your recent sexual partners need treatment. This is true even if they are symptom-free or have a negative culture or evaluation. They should not have sex until their health care providers say it is okay.  Your health care provider may test you for infection again 3 months after treatment.  How is this prevented? Take these steps to reduce your risk of getting an STD:  Use latex condoms, dental dams, and water-soluble lubricants during sexual activity. Do not use petroleum jelly or oils.  Avoid having multiple sex partners.  Do not have sex with someone who has other sex partners.  Do not have sex with anyone you do not know or who is at high risk for an STD.  Avoid risky sex practices that can break your skin.  Do not have sex if you have open sores on your mouth or skin.  Avoid drinking too much alcohol or taking illegal drugs. Alcohol and drugs can affect your judgment and put you in a vulnerable position.  Avoid engaging in oral and anal sex acts.  Get vaccinated for HPV and hepatitis. If you have not received these vaccines in the past, talk to your health care provider about whether one or both might be right for you.  If you are at risk of being infected with HIV, it is recommended that you take a prescription medicine daily to prevent HIV infection. This is called pre-exposure prophylaxis (PrEP). You are considered at risk if: ? You are a man who has sex with other men (MSM). ? You are a heterosexual man or woman and are sexually active with more than one partner. ? You take drugs by injection. ? You are sexually active with a partner who has HIV.  Talk with your health care provider about whether you are at high risk of being infected with HIV. If you choose to begin PrEP,  you should first be tested for HIV. You should then be tested every 3 months for as long as you are taking PrEP.  Contact a health care provider if:  See your health care provider.  Tell your sexual partner(s). They should be tested and treated for any STDs.  Do not have sex until your health care provider says it is okay. Get help right away if: Contact your health care provider right away if:  You have severe abdominal pain.  You are a man and notice swelling or pain in your testicles.  You are a woman and notice swelling or pain in your vagina.  This information is not intended to replace advice given to you by your health care provider. Make sure you discuss any questions you have with your health care provider. Document Released: 12/01/2002 Document Revised: 03/30/2016 Document Reviewed: 03/31/2013 Elsevier Interactive Patient Education  2018 [employer]

## 2018-01-08 LAB — RPR: RPR Ser Ql: NONREACTIVE

## 2018-01-08 LAB — URINE CYTOLOGY ANCILLARY ONLY
Chlamydia: NEGATIVE
NEISSERIA GONORRHEA: NEGATIVE
Trichomonas: NEGATIVE

## 2018-01-08 LAB — HIV ANTIBODY (ROUTINE TESTING W REFLEX): HIV SCREEN 4TH GENERATION: NONREACTIVE

## 2018-01-13 ENCOUNTER — Telehealth: Payer: Self-pay | Admitting: Student

## 2018-01-13 ENCOUNTER — Encounter: Payer: Self-pay | Admitting: Student

## 2018-01-13 NOTE — Telephone Encounter (Signed)
Would like results from urine sample and blood work.

## 2018-01-13 NOTE — Progress Notes (Signed)
Attempted to call patient to discuss lab results.  STD test negative.  The skin tests shows fungal infection.  We have already started medication for this.

## 2018-03-31 IMAGING — US US ABDOMEN COMPLETE
1 series · 14 of 25 positions shown · non-contrast
Comparison: None.

CLINICAL DATA: Initial evaluation for acute epigastric pain for 1
day.

EXAM:
ABDOMEN ULTRASOUND COMPLETE

[Series 1: us abdomen complete · 0.19mm/px · 14 of 88 slices shown]
[im 1/88]
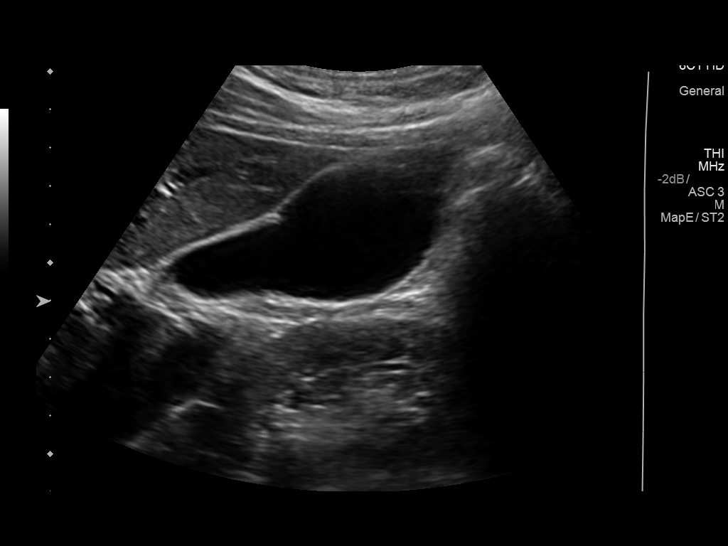
[im 8/88]
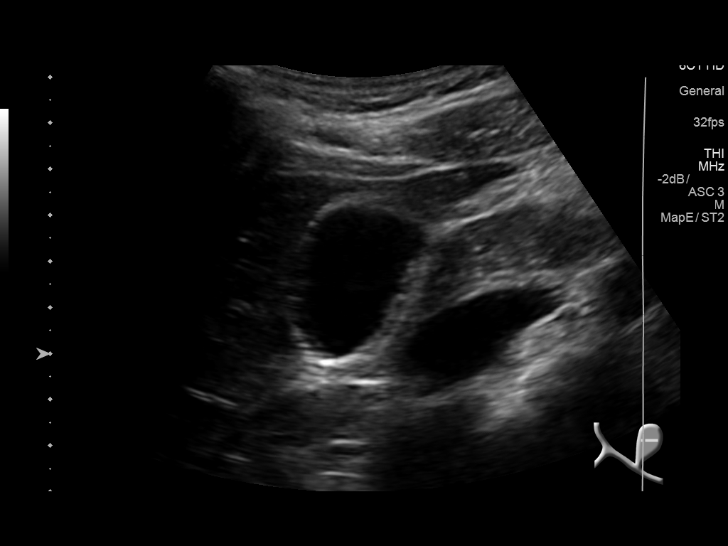
[im 15/88]
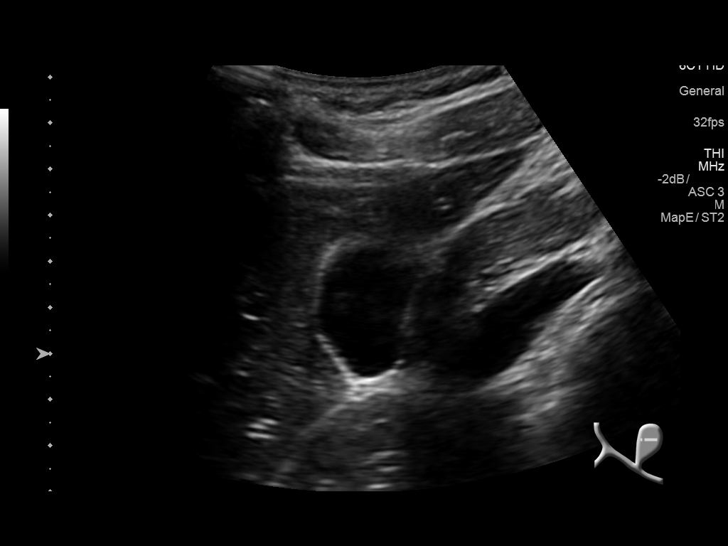
[im 22/88]
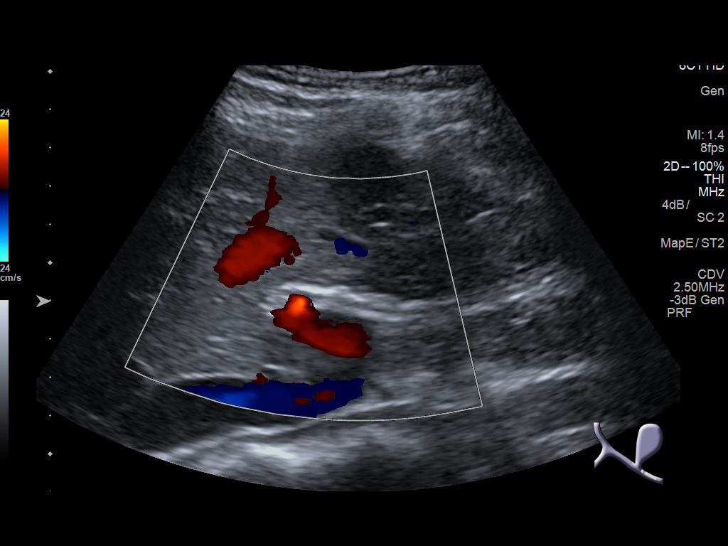
[im 30/88]
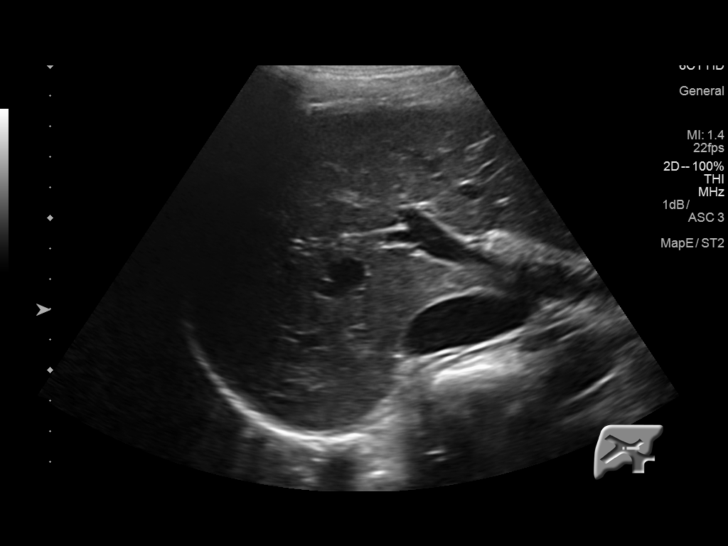
[im 33/88]
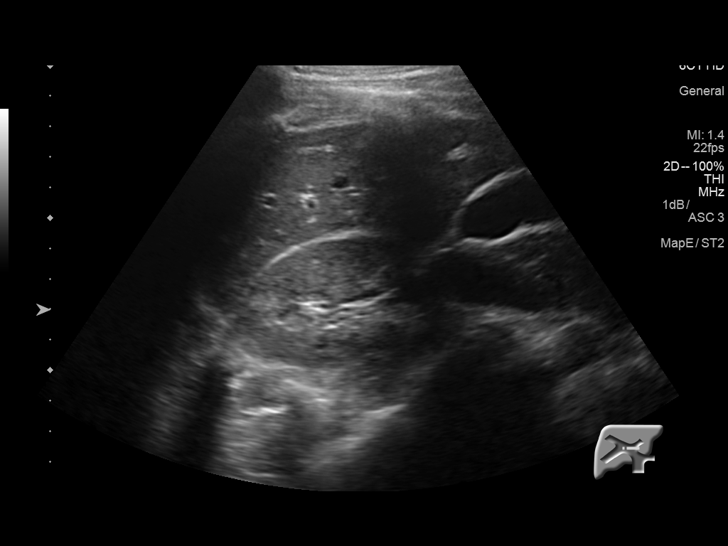
[im 40/88]
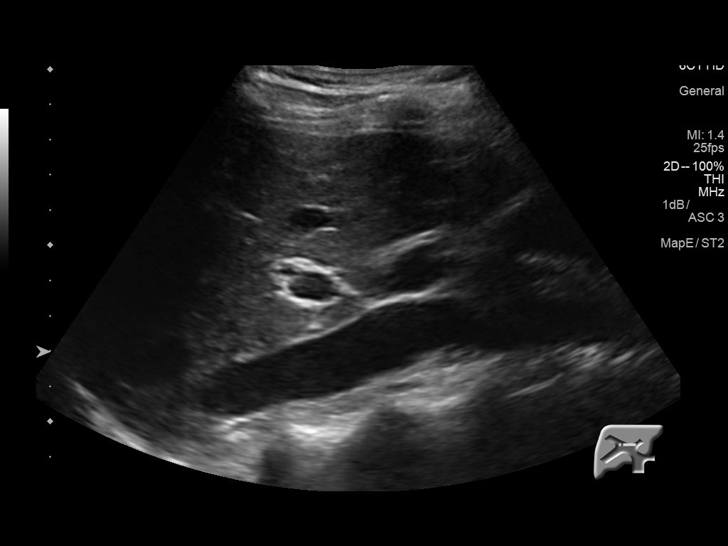
[im 48/88]
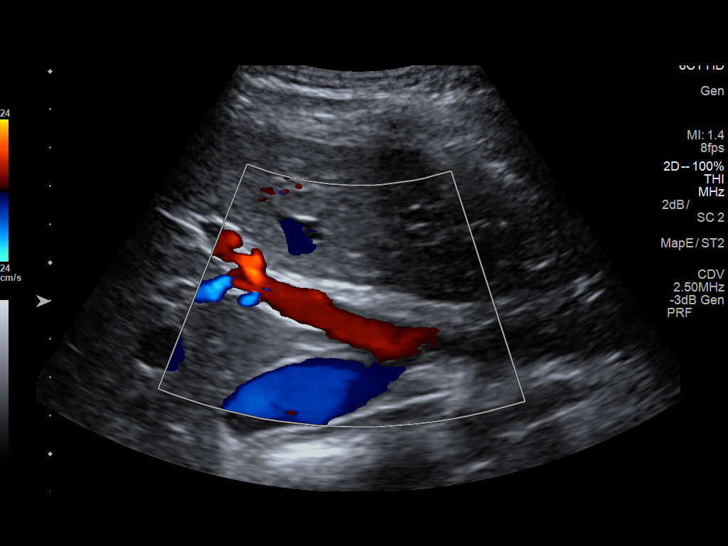
[im 55/88]
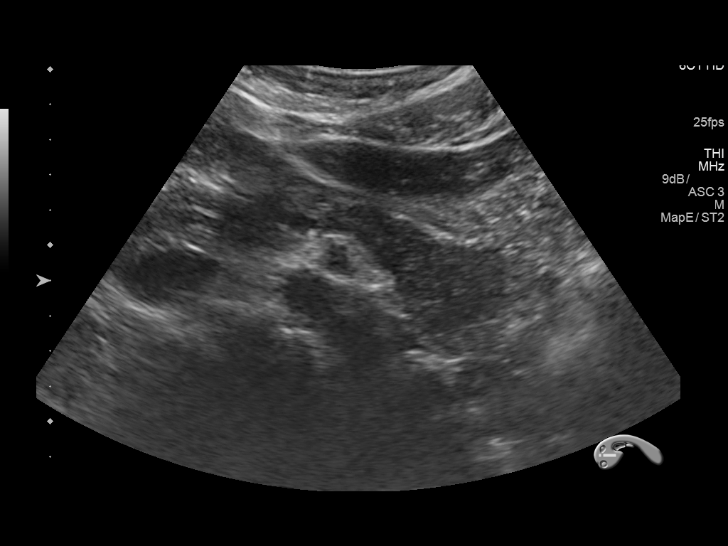
[im 59/88]
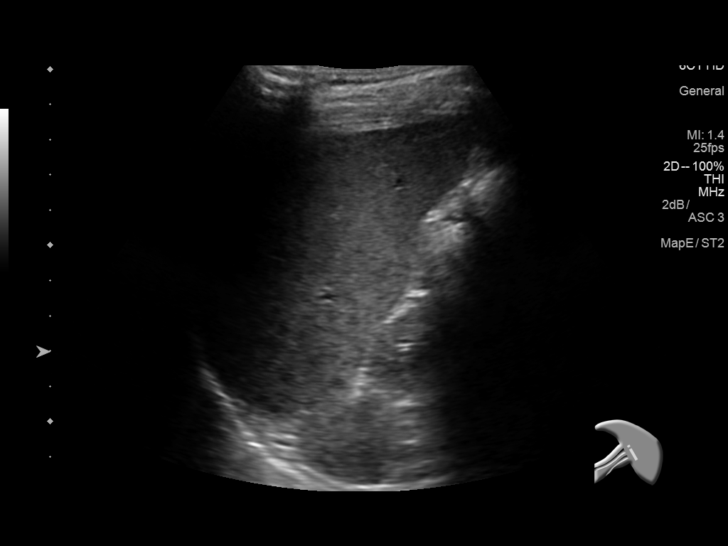
[im 66/88]
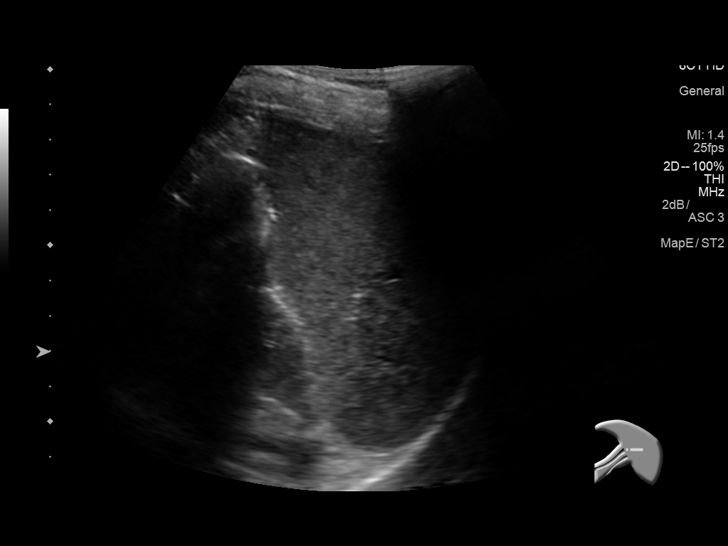
[im 73/88]
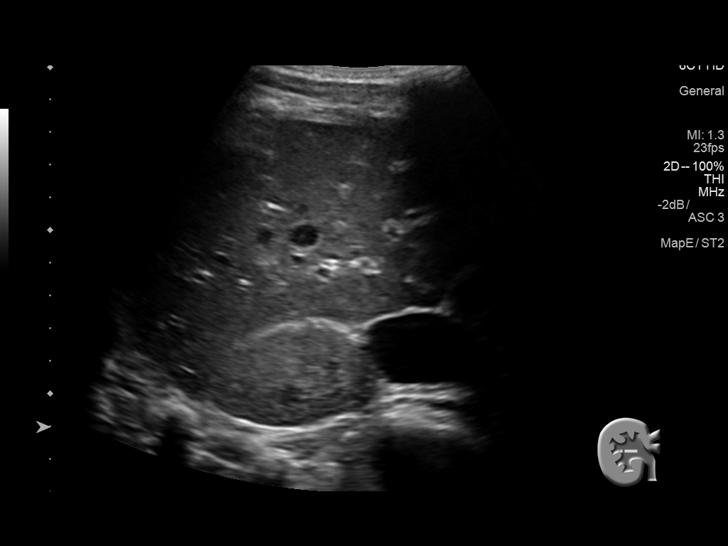
[im 80/88]
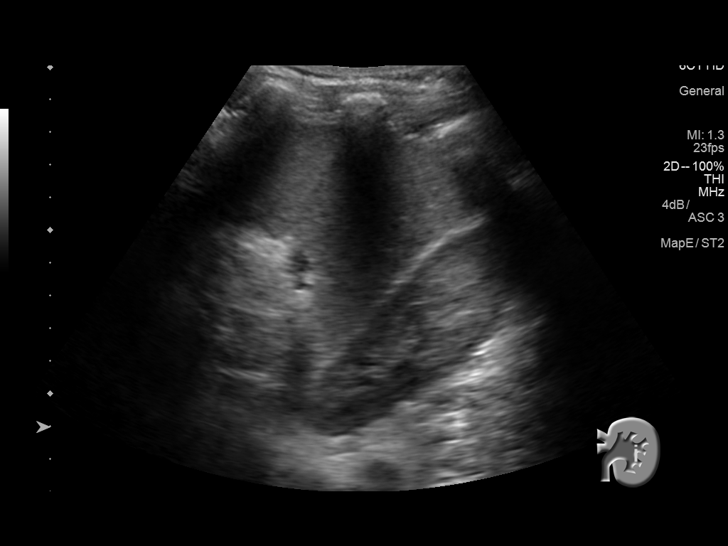
[im 88/88]
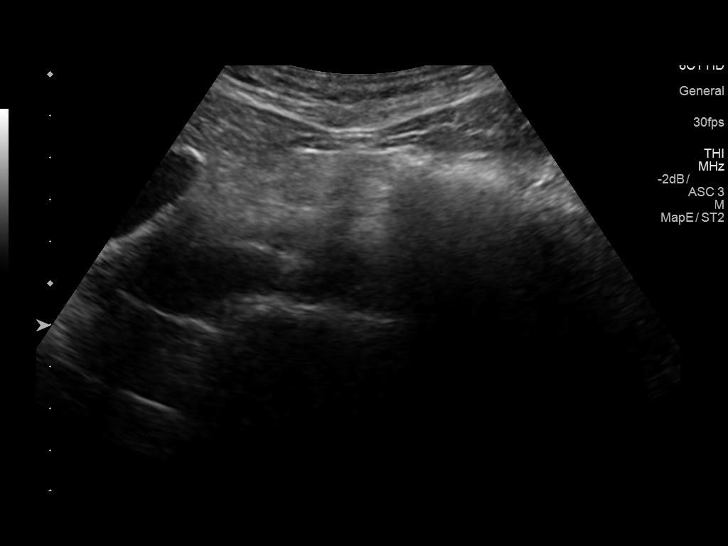

[14 of 25 positions shown; findings below may reference images not displayed]

FINDINGS: Gallbladder: No gallstones or wall thickening visualized. No
sonographic Murphy sign noted by sonographer.

Common bile duct: Diameter: 3.4 mm

Liver: No focal lesion identified. Within normal limits in
parenchymal echogenicity. Portal vein is patent on color Doppler
imaging with normal direction of blood flow towards the liver.

IVC: No abnormality visualized.

Pancreas: Visualized portion unremarkable.

Spleen: Size and appearance within normal limits.

Right Kidney: Length: 9.9 cm. Echogenicity within normal limits. No
mass or hydronephrosis visualized.

Left Kidney: Length: 10.2 cm. Echogenicity within normal limits. No
mass or hydronephrosis visualized.

Abdominal aorta: No aneurysm visualized.

Other findings: None.
IMPRESSION: Normal abdominal ultrasound.  No acute abnormality identified.

## 2018-08-13 ENCOUNTER — Ambulatory Visit: Payer: BLUE CROSS/BLUE SHIELD | Admitting: Licensed Clinical Social Worker

## 2018-08-13 ENCOUNTER — Ambulatory Visit (INDEPENDENT_AMBULATORY_CARE_PROVIDER_SITE_OTHER): Payer: BLUE CROSS/BLUE SHIELD | Admitting: Family Medicine

## 2018-08-13 ENCOUNTER — Other Ambulatory Visit (HOSPITAL_COMMUNITY)
Admission: RE | Admit: 2018-08-13 | Discharge: 2018-08-13 | Disposition: A | Discharge status: Home / Self Care | Payer: BLUE CROSS/BLUE SHIELD | Source: Ambulatory Visit | Attending: Family Medicine | Admitting: Family Medicine

## 2018-08-13 DIAGNOSIS — Z7251 High risk heterosexual behavior: Secondary | ICD-10-CM

## 2018-08-13 DIAGNOSIS — Z113 Encounter for screening for infections with a predominantly sexual mode of transmission: Secondary | ICD-10-CM

## 2018-08-13 DIAGNOSIS — L853 Xerosis cutis: Secondary | ICD-10-CM | POA: Insufficient documentation

## 2018-08-13 NOTE — Assessment & Plan Note (Addendum)
No known exposure.  History of chlamydia.  Asymptomatic. - Checking ancillary urine cytology for GC/chlamydia/trichomonas - Discussed safe sex practice

## 2018-08-13 NOTE — Assessment & Plan Note (Signed)
Acute on chronic.  Localized to back and lateral shoulders.  Consistent with dry skin likely exacerbated by season and heat. - Discussed conservative management including unscented lotions and soaps

## 2018-08-13 NOTE — Patient Instructions (Signed)
Thank you for coming in to see Christopher Townsend today. Please see below to review our plan for today's visit.  1.  The rash on your back is consistent with dry skin.  This is more common in the wintertime and with increased exposure to heat commonly from the air conditioning unit.  The rash this usually itchy and worse after getting out of a hot shower.  Be sure to use unscented products and you can apply lotion after drying off your back. 2.  I will call you if there is any abnormalities to your lab work, otherwise you should receive results in the mail.  Please call the clinic at [phone] if your symptoms worsen or you have any concerns. It was our pleasure to serve you.  Durward Parcelavid Kamonte Mcmichen, DO [facility], PGY-3

## 2018-08-13 NOTE — Progress Notes (Signed)
   Subjective   Patient ID: Christopher Townsend    DOB: 11/23/1995, 22 y.o. male   MRN: [id number]  CC: "I have a rash on my back"  HPI: Christopher Townsend is a 22 y.o. male who presents to clinic today for the following:  Back rash: Patient reports a rash all over his back in the lateral aspect of his shoulders bilaterally.  This is been an issue for him for over 1 year typically is worse in the winter.  He does report itching and some sharp tingling sensation across the back typically when getting out of the hot shower.  He has not tried any medications or products to alleviate his symptoms.  STI check: Patient would like to be screened for STI.  He has history of 2 male partners within the last year and no known STI exposure.  He does not use protection regularly.  He denies fevers or chills, penile discharge, dysuria, testicular pain or masses.  ROS: see HPI for pertinent.  PMFSH: Reviewed. Smoking status reviewed. Medications reviewed.  Objective   There were no vitals taken for this visit. Vitals and nursing note reviewed.  General: well nourished, well developed, NAD with non-toxic appearance HEENT: normocephalic, atraumatic, moist mucous membranes Cardiovascular: regular rate and rhythm without murmurs, rubs, or gallops Lungs: clear to auscultation bilaterally with normal work of breathing GU: circumcised penis without lesion or rash, no discharge that urethra, symmetrical testicles without tenderness or mass Skin: warm, dry, diffuse minimal flaking throughout back and on shoulders  Assessment & Plan   Dry skin Acute on chronic.  Localized to back and lateral shoulders.  Consistent with dry skin likely exacerbated by season and heat. - Discussed conservative management including unscented lotions and soaps  Routine screening for STI (sexually transmitted infection) No known exposure.  History of chlamydia.  Asymptomatic. - Checking ancillary urine cytology for  GC/chlamydia/trichomonas - Discussed safe sex practice  No orders of the defined types were placed in this encounter.  No orders of the defined types were placed in this encounter.   Durward Parcelavid McMullen, DO [facility], PGY-3 08/13/2018, 11:56 AM

## 2018-08-14 LAB — URINE CYTOLOGY ANCILLARY ONLY
CHLAMYDIA, DNA PROBE: NEGATIVE
NEISSERIA GONORRHEA: NEGATIVE

## 2018-08-15 ENCOUNTER — Encounter: Payer: Self-pay | Admitting: Family Medicine

## 2018-09-18 ENCOUNTER — Ambulatory Visit (HOSPITAL_COMMUNITY)
Admission: EM | Admit: 2018-09-18 | Discharge: 2018-09-18 | Disposition: A | Discharge status: Home / Self Care | Payer: BLUE CROSS/BLUE SHIELD | Attending: Family Medicine | Admitting: Family Medicine

## 2018-09-18 ENCOUNTER — Encounter (HOSPITAL_COMMUNITY): Payer: Self-pay | Admitting: Emergency Medicine

## 2018-09-18 DIAGNOSIS — J069 Acute upper respiratory infection, unspecified: Secondary | ICD-10-CM

## 2018-09-18 DIAGNOSIS — B356 Tinea cruris: Secondary | ICD-10-CM

## 2018-09-18 DIAGNOSIS — R590 Localized enlarged lymph nodes: Secondary | ICD-10-CM

## 2018-09-18 DIAGNOSIS — Z113 Encounter for screening for infections with a predominantly sexual mode of transmission: Secondary | ICD-10-CM | POA: Diagnosis not present

## 2018-09-18 DIAGNOSIS — Z7251 High risk heterosexual behavior: Secondary | ICD-10-CM | POA: Diagnosis present

## 2018-09-18 MED ORDER — AZITHROMYCIN 250 MG PO TABS
1000.0000 mg | ORAL_TABLET | Freq: Once | ORAL | Status: AC
  Administered 2018-09-18: 1000 mg via ORAL

## 2018-09-18 MED ORDER — AZITHROMYCIN 250 MG PO TABS
ORAL_TABLET | ORAL | Status: AC
  Filled 2018-09-18: qty 4

## 2018-09-18 MED ORDER — CLOTRIMAZOLE 1 % EX CREA: TOPICAL_CREAM | CUTANEOUS | 0 refills | Status: DC

## 2018-09-18 MED ORDER — CEFTRIAXONE SODIUM 250 MG IJ SOLR
INTRAMUSCULAR | Status: AC
  Filled 2018-09-18: qty 250

## 2018-09-18 MED ORDER — CEFTRIAXONE SODIUM 250 MG IJ SOLR
250.0000 mg | Freq: Once | INTRAMUSCULAR | Status: AC
  Administered 2018-09-18: 250 mg via INTRAMUSCULAR

## 2018-09-18 NOTE — Discharge Instructions (Addendum)

## 2018-09-18 NOTE — ED Triage Notes (Addendum)
[facility] of left groin pain x 2 days with swelling/"a lump", and patient states today he began to have fever, chills, body aches, nasal congestion.  Also c/o intermittent itchy rash x 5+ months, today on his left arm and chest.  Pt is also requesting to be checked for STIs.  Denies known exposure or symptoms.

## 2018-09-19 LAB — URINE CYTOLOGY ANCILLARY ONLY
Chlamydia: NEGATIVE
Neisseria Gonorrhea: NEGATIVE
TRICH (WINDOWPATH): NEGATIVE

## 2018-09-19 LAB — HIV ANTIBODY (ROUTINE TESTING W REFLEX): HIV Screen 4th Generation wRfx: NONREACTIVE

## 2018-09-19 LAB — RPR: RPR Ser Ql: NONREACTIVE

## 2018-09-20 NOTE — ED Provider Notes (Signed)
[facility]   [id number] 09/18/18 Arrival Time: 1119  ASSESSMENT & PLAN:  1. Screening for STDs (sexually transmitted diseases)   2. Inguinal lymphadenopathy   3. Viral URI   4. Tinea cruris       Discharge Instructions     You have been given the following medications today for treatment of suspected gonorrhea and/or chlamydia:  cefTRIAXone (ROCEPHIN) injection 250 mg azithromycin (ZITHROMAX) tablet 1,000 mg  Even though we have treated you today, we have sent testing for sexually transmitted infections. We will notify you of any positive results once they are received. If required, we will prescribe any medications you might need.  Please refrain from all sexual activity for at least the next seven days.    Pending: Labs Reviewed  RPR  HIV ANTIBODY (ROUTINE TESTING W REFLEX)  URINE CYTOLOGY ANCILLARY ONLY   Watch inguinal LAD closely. Agrees to f/u if not improving over the next 1-2 weeks. Will return sooner if needed.  Will notify of any positive results of STI testing. Instructed to refrain from sexual activity for at least seven days.  Discussed typical duration of early suspected viral URI. OTC tx as needed.  Reviewed expectations re: course of current medical issues. Questions answered. Outlined signs and symptoms indicating need for more acute intervention. Patient verbalized understanding. After Visit Summary given.   SUBJECTIVE:  Christopher Townsend is a 22 y.o. male who presents with "feeling a lump" of right inguinal area. Onset gradual, noticed sevral days ago. No penile discharge. Urinary symptoms: none. Afebrile. No abdominal or pelvic pain. No n/v. No rashes or lesions. Sexually active with multiple partners; not always with condom use. No h/o similar reported. Normal bowel movements.  Also reports mild nasal congestion and mild body aches for a few days. No significant coughing/SOB/wheezing. Afebrile.  Social History   Tobacco Use    Smoking Status Never Smoker  Smokeless Tobacco Never Used   ROS: As per HPI. All other systems negative.   OBJECTIVE:  Vitals:   09/18/18 1253  BP: 129/72  Pulse: 86  Resp: 16  Temp: 99.7 F (37.6 C)  TempSrc: Oral  SpO2: 98%     General appearance: alert, cooperative, appears stated age and no distress Throat: lips, mucosa, and tongue normal; teeth and gums normal; mild throat irritation likely from post-nasal drainage Cv: RRR Lungs: CTAB without wheezing Back: no CVA tenderness; FROM at waist Abdomen: soft, non-tender GU: normal penis and testicular exam; solitary R LAD approx 1 cm with mild tenderness; no bubo Skin: warm and dry Psychological: alert and cooperative; normal mood and affect.   Labs Reviewed  RPR  HIV ANTIBODY (ROUTINE TESTING W REFLEX)  URINE CYTOLOGY ANCILLARY ONLY    No Known Allergies  Past Medical History:  Diagnosis Date  . Anxiety   . Multiple allergies   . Seasonal allergies    Family History  Problem Relation Age of Onset  . Asthma Neg Hx   . Cancer Neg Hx   . Diabetes Neg Hx   . Heart failure Neg Hx   . Hyperlipidemia Neg Hx    Social History   Socioeconomic History  . Marital status: Single    Spouse name: Not on file  . Number of children: Not on file  . Years of education: Not on file  . Highest education level: Not on file  Occupational History  . Not on file  Social Needs  . Financial resource strain: Not on file  . Food insecurity:  Worry: Not on file    Inability: Not on file  . Transportation needs:    Medical: Not on file    Non-medical: Not on file  Tobacco Use  . Smoking status: Never Smoker  . Smokeless tobacco: Never Used  Substance and Sexual Activity  . Alcohol use: No  . Drug use: Yes    Types: Marijuana    Comment: Hx marijuana use  . Sexual activity: Yes    Birth control/protection: Condom  Lifestyle  . Physical activity:    Days per week: Not on file    Minutes per session: Not on file   . Stress: Not on file  Relationships  . Social connections:    Talks on phone: Not on file    Gets together: Not on file    Attends religious service: Not on file    Active member of club or organization: Not on file    Attends meetings of clubs or organizations: Not on file    Relationship status: Not on file  . Intimate partner violence:    Fear of current or ex partner: Not on file    Emotionally abused: Not on file    Physically abused: Not on file    Forced sexual activity: Not on file  Other Topics Concern  . Not on file  Social History Narrative  . Not on file          Mardella LaymanHagler, Areatha Kalata, MD 10/01/18 [phone]

## 2018-09-25 ENCOUNTER — Encounter: Payer: Self-pay | Admitting: Student in an Organized Health Care Education/Training Program

## 2018-10-08 ENCOUNTER — Ambulatory Visit (INDEPENDENT_AMBULATORY_CARE_PROVIDER_SITE_OTHER): Payer: Self-pay | Admitting: Family Medicine

## 2018-10-08 VITALS — BP 100/65 | HR 68 | Temp 97.8°F | Wt 157.4 lb

## 2018-10-08 DIAGNOSIS — R590 Localized enlarged lymph nodes: Secondary | ICD-10-CM | POA: Insufficient documentation

## 2018-10-08 LAB — POCT URINALYSIS DIP (MANUAL ENTRY)
Bilirubin, UA: NEGATIVE
Blood, UA: NEGATIVE
Glucose, UA: NEGATIVE mg/dL
Ketones, POC UA: NEGATIVE mg/dL
Leukocytes, UA: NEGATIVE
Nitrite, UA: NEGATIVE
PROTEIN UA: NEGATIVE mg/dL
Spec Grav, UA: 1.02 (ref 1.010–1.025)
Urobilinogen, UA: 2 E.U./dL — AB
pH, UA: 7 (ref 5.0–8.0)

## 2018-10-08 NOTE — Assessment & Plan Note (Addendum)
Patient has had left sided painless inguinal lump that was first investigated approximately 4 weeks ago.  He was found to have no STIs.  He is in a monogomous relationship. Does not appear to be a hernia.  Patient not having any signs of infection though we will get UA and CBC to rule out infection given that STIs have been ruled out. Will also have him follow up in two weeks for biopsy if it is still enlarged. Marland Kitchen

## 2018-10-08 NOTE — Progress Notes (Signed)
   [facility] Phone: [phone]   cc: left inquinal lump.  Subjective:  Patient has a lump on the left side of his groin.  First started noticing it about trhree weeks ago. It is not painful.  It Only hurts if he applies pressure.  Was checked for STIs because of this back on December 26th and these were negative. The onset of the lump was not associated with any heavy lifting. No pain with lifting now. Patient Works at [employer], Engineer, productionlifting furniture. No belly pain.  Has mild odor with urination but no dysuria. No increased frequency.   Is in monogomous relationship with girlfriend who is negative for STI.  No sores in his genital region.    ROS: See HPI for pertinent positives and negatives.  No fevers. No n/v/d.  No night sweats.   Past Medical History  Family history reviewed for today's visit. No changes.  Social history- patient is a non smoker  Objective: BP 100/65   Pulse 68   Temp 97.8 F (36.6 C) (Oral)   Wt 157 lb 6.4 oz (71.4 kg)   SpO2 99%   BMI 21.35 kg/m  Gen: NAD, alert and oriented, cooperative with exam CV: normal rate, regular rhythm. No murmurs, no rubs.  Resp: LCTAB, no wheezes, crackles. normal work of breathing GI: nontender to palpation, BS present, no guarding or organomegaly GU: patient has an approximately 1.2 cm painless, firm mass in his left inguinal region. No lesions visible on genitals.  No lumps or tenderness on his testicles.   Skin: No rashes, no lesions Psych: Appropriate behavior  Assessment/Plan: Inguinal lymphadenopathy Patient has had left sided painless inguinal lump that was first investigated approximately 4 weeks ago.  He was found to have no STIs.  He is in a monogomous relationship. Does not appear to be a hernia.  Patient not having any signs of infection though we will get UTI and CBC to rule out infection given that STIs have been ruled out. Will also have him follow up in two weeks for biopsy if it is still  enlarged. Frederic Jericho.     Dan Numa Heatwole, MD PGY-1

## 2018-10-08 NOTE — Patient Instructions (Addendum)
I have ordered a urinalysis to rule out a UTI.  I have also ordered a blood test to look for signs of infection.  I do not believe this is anything serious but we need to rule out some serious possibilities, like cancer. Therefore, I would like you to come back in two weeks and if it still there we can biopsy it.    I will discuss your results of the tests today when you come back in two weeks.    Have a great day  Frederic Jericho, MD

## 2018-10-09 LAB — CBC WITH DIFFERENTIAL/PLATELET
Basophils Absolute: 0.1 10*3/uL (ref 0.0–0.2)
Basos: 1 %
EOS (ABSOLUTE): 0.3 10*3/uL (ref 0.0–0.4)
Eos: 6 %
Hematocrit: 44.8 % (ref 37.5–51.0)
Hemoglobin: 15 g/dL (ref 13.0–17.7)
Immature Grans (Abs): 0 10*3/uL (ref 0.0–0.1)
Immature Granulocytes: 0 %
Lymphocytes Absolute: 2.5 10*3/uL (ref 0.7–3.1)
Lymphs: 56 %
MCH: 27.1 pg (ref 26.6–33.0)
MCHC: 33.5 g/dL (ref 31.5–35.7)
MCV: 81 fL (ref 79–97)
Monocytes Absolute: 0.4 10*3/uL (ref 0.1–0.9)
Monocytes: 8 %
Neutrophils Absolute: 1.3 10*3/uL — ABNORMAL LOW (ref 1.4–7.0)
Neutrophils: 29 %
Platelets: 245 10*3/uL (ref 150–450)
RBC: 5.53 x10E6/uL (ref 4.14–5.80)
RDW: 14.4 % (ref 11.6–15.4)
WBC: 4.4 10*3/uL (ref 3.4–10.8)

## 2018-11-12 ENCOUNTER — Ambulatory Visit (INDEPENDENT_AMBULATORY_CARE_PROVIDER_SITE_OTHER): Payer: Self-pay | Admitting: Family Medicine

## 2018-11-12 VITALS — BP 100/80 | HR 63 | Temp 98.1°F | Wt 165.4 lb

## 2018-11-12 DIAGNOSIS — Z113 Encounter for screening for infections with a predominantly sexual mode of transmission: Secondary | ICD-10-CM

## 2018-11-12 NOTE — Assessment & Plan Note (Signed)
Asymptomatic.  Patient opting for full work-up.  History of STI.  He did urinate here at the clinic prior to interview. - Checking HIV antibody and RPR and will give specimen cup with instructions to follow-up for GC/chlamydia - Discussed safe sex practice and return precautions

## 2018-11-12 NOTE — Progress Notes (Signed)
   Subjective   Patient ID: Christopher Townsend    DOB: 12/21/1995, 23 y.o. male   MRN: [id number]  CC: "STI screening"  HPI: Christopher Townsend is a 23 y.o. male who presents to clinic today for the following:  STI screening: Patient is here for STI and HIV screening.  He is asymptomatic and denies history of fevers or chills, dysuria, urinary frequency or urgency, testicular pain or swelling, or sore throat.  He does report a history of gonorrhea and chlamydia back in 2017 and was medically treated.  He is currently sexually active with only females and one partner in the last year.  He is here today because he has concerns that she may be cheating on him based on text messages.  ROS: see HPI for pertinent.  PMFSH: Reviewed. Smoking status reviewed. Medications reviewed.  Objective   BP 100/80   Pulse 63   Temp 98.1 F (36.7 C) (Oral)   Wt 165 lb 6.4 oz (75 kg)   SpO2 98%   BMI 22.43 kg/m  Vitals and nursing note reviewed.  General: well nourished, well developed, NAD with non-toxic appearance HEENT: normocephalic, atraumatic, moist mucous membranes Lungs: normal work of breathing GU: circumcised penis without lesion on shaft or glans, no drainage at urethral os, no palpable testicular mass or tenderness, no signs of herniation Skin: warm, dry, no rashes or lesions Extremities: warm and well perfused, normal tone, no edema  Assessment & Plan   Routine screening for STI (sexually transmitted infection) Asymptomatic.  Patient opting for full work-up.  History of STI.  He did urinate here at the clinic prior to interview. - Checking HIV antibody and RPR and will give specimen cup with instructions to follow-up for GC/chlamydia - Discussed safe sex practice and return precautions  Orders Placed This Encounter  Procedures  . HIV antibody (with reflex)  . RPR   No orders of the defined types were placed in this encounter.   Durward Parcel, DO [facility],  PGY-3 11/12/2018, 4:56 PM

## 2018-11-12 NOTE — Patient Instructions (Signed)
Thank you for coming in to see [place] today. Please see below to review our plan for today's visit.  I will call you if there is anything abnormal on your blood tests within 48 hours, otherwise you should receive results in the mail.  Please come back on 2/20 to give a urine sample.  I will call you about the results if there is anything abnormal within 48 hours.  Please call the clinic at [phone] if your symptoms worsen or you have any concerns. It was our pleasure to serve you.  Durward Parcel, DO [facility], PGY-3

## 2018-11-13 ENCOUNTER — Encounter: Payer: Self-pay | Admitting: Family Medicine

## 2018-11-13 LAB — HIV ANTIBODY (ROUTINE TESTING W REFLEX): HIV Screen 4th Generation wRfx: NONREACTIVE

## 2018-11-13 LAB — RPR: RPR Ser Ql: NONREACTIVE

## 2018-12-16 ENCOUNTER — Telehealth: Payer: Self-pay | Admitting: Family Medicine

## 2018-12-16 NOTE — Telephone Encounter (Signed)
Pt sent a message on mychart requesting and appointment to be seen for having fertility issues with his partner. On mychart he requested seeing Dr. Abelardo Diesel but his PCP is Dr. Chanetta Marshall. I informed him that we are limiting the amount of appointments scheduled and that I would have his doctor call him.   Please advise

## 2018-12-16 NOTE — Telephone Encounter (Signed)
mychart message sent to patient. Christopher Townsend,CMA  

## 2018-12-16 NOTE — Telephone Encounter (Signed)
Called number - VM is full. If he returns call, I would direct him to the telemedicine provider if I am not around. I think this is likely something that can be handled via phone.

## 2019-03-16 ENCOUNTER — Other Ambulatory Visit: Payer: Self-pay

## 2019-03-16 ENCOUNTER — Ambulatory Visit (INDEPENDENT_AMBULATORY_CARE_PROVIDER_SITE_OTHER): Payer: Self-pay | Admitting: Family Medicine

## 2019-03-16 ENCOUNTER — Other Ambulatory Visit (HOSPITAL_COMMUNITY)
Admission: RE | Admit: 2019-03-16 | Discharge: 2019-03-16 | Disposition: A | Discharge status: Home / Self Care | Payer: Self-pay | Source: Ambulatory Visit | Attending: Family Medicine | Admitting: Family Medicine

## 2019-03-16 ENCOUNTER — Encounter: Payer: Self-pay | Admitting: Family Medicine

## 2019-03-16 VITALS — BP 110/68 | HR 64

## 2019-03-16 DIAGNOSIS — Z202 Contact with and (suspected) exposure to infections with a predominantly sexual mode of transmission: Secondary | ICD-10-CM | POA: Insufficient documentation

## 2019-03-16 NOTE — Progress Notes (Signed)
   CC: penile concern, STD check  HPI  Penile concern - has been tested for STIs several times, negative. Noticed a bump on the shaft of the penis. Noticed this about 2-3 days ago. No drainage, no bump there before. Sexually active with women 1 partner, last sexual encounter was 1 week before lump appeared. Sometimes wears condoms, sometimes not. No known hx of STIs in either partner. No fevers. The ?lymph node from January is totally gone. Works in delivery. Has maybe a skin rash around the penis. No sighting of insect bites. Painless bump.   ROS: Denies CP, SOB, abdominal pain, dysuria, changes in BMs.   CC, SH/smoking status, and VS noted  Objective: BP 110/68   Pulse 64   SpO2 99%  Gen: NAD, alert, cooperative, and pleasant. HEENT: NCAT, EOMI, PERRL CV: RRR, no murmur Resp: CTAB, no wheezes, non-labored GU: fordyce spots on base of glans, one 0.5cm hypopigmented shiny slightly raised lesion on L lateral ventral side of penis, no discharge Ext: No edema, warm Neuro: Alert and oriented, Speech clear, No gross deficits  Assessment and plan:  STI screening: ordered, will call with results.   Fordyce spots - reassured  Penile lesion- precepted with Dr. McDiarmid, we think this may be scar tissue, less likely infectious or worrisome, continue to monitor and call if changes.    Ralene Ok, MD, PGY3 03/18/2019 12:00 PM

## 2019-03-16 NOTE — Patient Instructions (Signed)
The bump is nothing scary, maybe scar tissue. NOT an infection. You do not need repeat blood testing today. We will call with G/c test results. Call me and come back in if this lesion changes or stuff drains from it or it becomes painful.

## 2019-03-17 LAB — CERVICOVAGINAL ANCILLARY ONLY
Chlamydia: POSITIVE — AB
Neisseria Gonorrhea: NEGATIVE
Trichomonas: NEGATIVE

## 2019-03-18 ENCOUNTER — Encounter: Payer: Self-pay | Admitting: Family Medicine

## 2019-03-25 ENCOUNTER — Other Ambulatory Visit: Payer: Self-pay

## 2019-03-25 ENCOUNTER — Ambulatory Visit (INDEPENDENT_AMBULATORY_CARE_PROVIDER_SITE_OTHER): Payer: Self-pay

## 2019-03-25 ENCOUNTER — Telehealth: Payer: Self-pay

## 2019-03-25 DIAGNOSIS — A749 Chlamydial infection, unspecified: Secondary | ICD-10-CM

## 2019-03-25 MED ORDER — AZITHROMYCIN 500 MG PO TABS
1000.0000 mg | ORAL_TABLET | Freq: Once | ORAL | Status: AC
  Administered 2019-03-25: 1000 mg via ORAL

## 2019-03-25 NOTE — Addendum Note (Signed)
Addended by: Dorna Bloom on: 03/25/2019 02:57 PM   Modules accepted: Orders

## 2019-03-25 NOTE — Telephone Encounter (Signed)
Patient contacted and informed of positive chlamydia. Patient scheduled for nurse visit with me today at 2pm for 1 gram azithromycin PO per orders.

## 2019-03-25 NOTE — Progress Notes (Signed)
Patient in nurse clinic today for STD treatment of Chlamydia.    Patient advised to abstain from sex for 7-10 days after treatment or when partner has been tested/treated.    Azithromycin 1 GM PO x 1 given per Dr. Toni Amend orders.  Patient observed 15 minutes in office.  No reaction noted.   Patient to follow up in 2-3 months for re-screening.    Provided condoms and advised to use with all sexual activity. Patient verbalized understanding.

## 2019-04-17 ENCOUNTER — Other Ambulatory Visit: Payer: Self-pay

## 2019-04-17 ENCOUNTER — Encounter: Payer: Self-pay | Admitting: Family Medicine

## 2019-04-17 ENCOUNTER — Other Ambulatory Visit: Payer: Self-pay | Admitting: Family Medicine

## 2019-04-17 ENCOUNTER — Other Ambulatory Visit (HOSPITAL_COMMUNITY)
Admission: RE | Admit: 2019-04-17 | Discharge: 2019-04-17 | Disposition: A | Discharge status: Home / Self Care | Payer: Self-pay | Source: Ambulatory Visit | Attending: Family Medicine | Admitting: Family Medicine

## 2019-04-17 ENCOUNTER — Ambulatory Visit (INDEPENDENT_AMBULATORY_CARE_PROVIDER_SITE_OTHER): Payer: Self-pay | Admitting: Family Medicine

## 2019-04-17 VITALS — BP 104/70 | HR 49

## 2019-04-17 DIAGNOSIS — Z113 Encounter for screening for infections with a predominantly sexual mode of transmission: Secondary | ICD-10-CM

## 2019-04-17 NOTE — Progress Notes (Signed)
Presented fot Test of Cure only.  No symptoms, no concerns to discuss with provider.  Will no charge and change to nurse visit.

## 2019-04-21 ENCOUNTER — Telehealth: Payer: Self-pay | Admitting: *Deleted

## 2019-04-21 LAB — URINE CYTOLOGY ANCILLARY ONLY
Chlamydia: NEGATIVE
Neisseria Gonorrhea: NEGATIVE

## 2019-04-21 NOTE — Telephone Encounter (Signed)
Tried to contact pt to inform him of below and phone stated that my call could not be completed at this time. April Zimmerman Rumple, CMA

## 2019-04-21 NOTE — Telephone Encounter (Signed)
-----   Message from Cleophas Dunker, DO sent at 04/21/2019  3:21 PM EDT ----- Negative.  Please inform patient.

## 2019-04-23 NOTE — Telephone Encounter (Signed)
2nd attempt to reach pt to inform him of below but phone stated that call could not be completed at this time.  If pt calls back please inform him of below. April Zimmerman Rumple, CMA

## 2019-12-17 ENCOUNTER — Ambulatory Visit: Payer: Self-pay | Attending: Internal Medicine

## 2021-05-01 ENCOUNTER — Other Ambulatory Visit: Payer: Self-pay

## 2021-05-01 ENCOUNTER — Encounter: Payer: Self-pay | Admitting: Family Medicine

## 2021-05-01 ENCOUNTER — Other Ambulatory Visit (HOSPITAL_COMMUNITY)
Admission: RE | Admit: 2021-05-01 | Discharge: 2021-05-01 | Disposition: A | Discharge status: Home / Self Care | Payer: Self-pay | Source: Ambulatory Visit | Attending: Family Medicine | Admitting: Family Medicine

## 2021-05-01 ENCOUNTER — Ambulatory Visit (INDEPENDENT_AMBULATORY_CARE_PROVIDER_SITE_OTHER): Payer: Self-pay | Admitting: Family Medicine

## 2021-05-01 VITALS — BP 100/62 | HR 54 | Ht 72.0 in | Wt 170.6 lb

## 2021-05-01 DIAGNOSIS — Z0000 Encounter for general adult medical examination without abnormal findings: Secondary | ICD-10-CM

## 2021-05-01 DIAGNOSIS — Z113 Encounter for screening for infections with a predominantly sexual mode of transmission: Secondary | ICD-10-CM | POA: Insufficient documentation

## 2021-05-01 NOTE — Progress Notes (Signed)
    SUBJECTIVE:   Chief compliant/HPI: annual examination  Christopher Townsend is a 25 y.o. who presents today for an annual exam.   Medical history: No chronic medical problems. Previous hx of STIs which were treated Surgical history: None Family history: Mom has diabetes and HTN. Remainder of family members are healthy without known medical problems.  Social hx: works for [employer], lives with his parents. Reports he likes to read the Bible during his free time. Denies tobacco or recreational drug use. Drinks alcohol only on special occasions (2 drinks) Exercise: lifts weights daily ,120 minutes per week Sexually active: yes with 1 male partner, uses condoms sometimes  OBJECTIVE:   BP 100/62   Pulse (!) 54   Ht 6' (1.829 m)   Wt 170 lb 9.6 oz (77.4 kg)   SpO2 99%   BMI 23.14 kg/m   General: NAD, pleasant, able to participate in exam Cardiac: RRR, S1 S2 present. normal heart sounds, no murmurs. Respiratory: CTAB, normal effort, No wheezes, rales or rhonchi Abdomen: Bowel sounds present, non-tender, non-distended, no hepatosplenomegaly Extremities: no edema or cyanosis. Skin: warm and dry, no rashes noted Neuro: alert, no obvious focal deficits Psych: Normal affect and mood   ASSESSMENT/PLAN:    Annual Examination  PHQ score 0, reviewed and discussed.  Blood pressure reviewed and at goal.    Advanced directives counseling provided. Patient will think about who he wants to make healthcare decisions if necessary, and what he may want in these situations.  Considered the following items based upon USPSTF recommendations: HIV testing: ordered Hepatitis C: ordered Hepatitis B: not indicated Syphilis if at high risk: ordered GC/CTordered Lipid panel (nonfasting or fasting) discussed based upon AHA recommendations and not ordered.  Consider repeat every 4-6 years.  Reviewed risk factors for latent tuberculosis and not indicated Immunizations:  patient declines COVID vaccine and Tdap. Immunizations otherwise UTD.   Follow up in 1 year or sooner if indicated.    Maury Dus, MD [facility]

## 2021-05-01 NOTE — Patient Instructions (Addendum)
It was great to meet you!  Things we discussed at today's visit: - We are checking you for STDs today. I will call you with the results. - Be sure to see a dentist at least once per year - It's never too early to think about who would make health decisions if you couldn't make them for yourself, and what you would want in these situations. - Continue to get 150 minutes of exercise per week - Aim for 5 servings of fruits and vegetables daily - Your checkup was normal today. I do not have any concerns  Take care and seek immediate care sooner if you develop any concerns.  Dr. [facility]

## 2021-05-02 LAB — HCV INTERPRETATION

## 2021-05-02 LAB — HIV ANTIBODY (ROUTINE TESTING W REFLEX): HIV Screen 4th Generation wRfx: NONREACTIVE

## 2021-05-02 LAB — HCV AB W REFLEX TO QUANT PCR: HCV Ab: <0.1 s/co ratio (ref 0.0–0.9)

## 2021-05-02 LAB — RPR: RPR Ser Ql: NONREACTIVE

## 2021-05-03 LAB — URINE CYTOLOGY ANCILLARY ONLY
Chlamydia: NEGATIVE
Comment: NEGATIVE
Comment: NEGATIVE
Comment: NORMAL
Neisseria Gonorrhea: NEGATIVE
Trichomonas: NEGATIVE

## 2021-08-30 ENCOUNTER — Other Ambulatory Visit: Payer: Self-pay

## 2021-08-30 ENCOUNTER — Encounter: Payer: Self-pay | Admitting: Family Medicine

## 2021-08-30 ENCOUNTER — Ambulatory Visit (INDEPENDENT_AMBULATORY_CARE_PROVIDER_SITE_OTHER): Payer: Self-pay | Admitting: Family Medicine

## 2021-08-30 ENCOUNTER — Other Ambulatory Visit (HOSPITAL_COMMUNITY)
Admission: RE | Admit: 2021-08-30 | Discharge: 2021-08-30 | Disposition: A | Discharge status: Home / Self Care | Payer: Self-pay | Source: Ambulatory Visit | Attending: Family Medicine | Admitting: Family Medicine

## 2021-08-30 VITALS — BP 108/57 | HR 60 | Ht 72.0 in | Wt 174.4 lb

## 2021-08-30 DIAGNOSIS — L989 Disorder of the skin and subcutaneous tissue, unspecified: Secondary | ICD-10-CM

## 2021-08-30 DIAGNOSIS — R369 Urethral discharge, unspecified: Secondary | ICD-10-CM | POA: Insufficient documentation

## 2021-08-30 MED ORDER — DOXYCYCLINE HYCLATE 100 MG PO TABS: 100.0000 mg | ORAL_TABLET | Freq: Two times a day (BID) | ORAL | 0 refills | Status: AC

## 2021-08-30 NOTE — Progress Notes (Signed)
    SUBJECTIVE:   CHIEF COMPLAINT / HPI:   Sore on L Leg He scraped his lower left leg he thinks on a piece of metal 5-6 months ago.  It never completely healed.  Sometimes leaks fluid or pus.  He has been using neosporin.  Never had any other treatments.  Did not mention on his physical exam in August 2022.  Genital Lesions Intermittently has discharge from his penis.  Has been tested in past and recenlty has been negative.  Does not have currently He does have a dark area on the tip of his penis that he thinks began as a tingly area with perhaps blisters.  He has these intermittently. No specific concerns about his current sexual partners.  Not interested in Prep   PERTINENT  PMH / PSH: Chlamydia treated in June 2020 Two negative tests since last in August  OBJECTIVE:   BP (!) 108/57   Pulse 60   Ht 6' (1.829 m)   Wt 174 lb 6.4 oz (79.1 kg)   SpO2 99%   BMI 23.65 kg/m   Genitals -5 mm dark slightly scaly are on glans.  No urethral discharge no adenopathy no lesions on shaft or testicles L leg  Chronic appearing lesion with multiple areas of epithelial disruption.  No fluctuance or expressible pus.  No groin adenopathy     ASSESSMENT/PLAN:   Leg skin lesion, left New lesion on lower left leg.  Nonhealing traumatic.  Looks chronically infected.  Will treat with course of doxycycline and stop all topicals except vaseline in case their is an contact component.  Follow up to ensure healing.  If does not may need either longer course of antibiotics (If partially responds) or biopsy with culture.  Will check HIV for possible STD exposure  Penile discharge By history. Does not have on exam now.  Will check GC chlamydia.  He also would like to be checked for RPR and HIV  His intermittent rash could be herpes but no fresh lesions to test.  Ask him to come in immediately with a new outbreak     Carney Living, MD [facility]

## 2021-08-30 NOTE — Patient Instructions (Addendum)
Good to see you today - Thank you for coming in  Things we discussed today:  L leg sore - use only vaseline twice a day and cover if bothering you or you might hit it - Take Doxycyline 100 mg twice a day for 7 days  - Sore should heal over in the next month - If not then come back   Come in immediately when you start to have a sore on your private area.   I will be in touch with your urine tests  Consider PreP to prevent HIV  Always use condoms

## 2021-08-30 NOTE — Assessment & Plan Note (Signed)
By history. Does not have on exam now.  Will check GC chlamydia.  He also would like to be checked for RPR and HIV  His intermittent rash could be herpes but no fresh lesions to test.  Ask him to come in immediately with a new outbreak

## 2021-08-30 NOTE — Assessment & Plan Note (Signed)
New lesion on lower left leg.  Nonhealing traumatic.  Looks chronically infected.  Will treat with course of doxycycline and stop all topicals except vaseline in case their is an contact component.  Follow up to ensure healing.  If does not may need either longer course of antibiotics (If partially responds) or biopsy with culture.  Will check HIV for possible STD exposure

## 2021-08-31 LAB — URINE CYTOLOGY ANCILLARY ONLY
Chlamydia: NEGATIVE
Comment: NEGATIVE
Comment: NEGATIVE
Comment: NORMAL
Neisseria Gonorrhea: NEGATIVE
Trichomonas: NEGATIVE

## 2021-10-03 NOTE — Progress Notes (Signed)
error 

## 2021-10-31 ENCOUNTER — Other Ambulatory Visit: Payer: Self-pay

## 2021-10-31 ENCOUNTER — Encounter: Payer: Self-pay | Admitting: Family Medicine

## 2021-10-31 ENCOUNTER — Ambulatory Visit (INDEPENDENT_AMBULATORY_CARE_PROVIDER_SITE_OTHER): Payer: Self-pay | Admitting: Family Medicine

## 2021-10-31 VITALS — BP 110/73 | HR 66 | Ht 72.0 in | Wt 185.0 lb

## 2021-10-31 DIAGNOSIS — L989 Disorder of the skin and subcutaneous tissue, unspecified: Secondary | ICD-10-CM

## 2021-10-31 NOTE — Progress Notes (Signed)
° ° °  SUBJECTIVE:   CHIEF COMPLAINT / HPI:   Wound reevaluation Patient reports that since being seen previously his wound has been improving.  Denies any fever or warmth around the wound.  Reports decreased amount of drainage and when it does drain it is a serous drainage.  He has completed his antibiotic course.  Reports the pain is improving.  Is wondering if there is anything he can put on it to help the scar go away.  OBJECTIVE:   BP 110/73    Pulse 66    Ht 6' (1.829 m)    Wt 185 lb (83.9 kg)    SpO2 99%    BMI 25.09 kg/m   General: Pleasant 26 year old male Respiratory: Normal work of breathing, speaking in full sentences MSK: Patient with superficial skin wound to left shin which is improving from previous evaluation.  No drainage, warmth appreciated    ASSESSMENT/PLAN:   Leg skin lesion, left Wound is healing well.  Finish course of doxycycline.  Recommend hydrocolloid dressing for continued improvement and Vaseline to help with moisturization and new skin formation.  Strict ED and return precautions given.     Derrel Nip, MD [facility]

## 2021-10-31 NOTE — Patient Instructions (Signed)
It was good seeing you today.  Your wound appears to be healing well.  To help with the scarring and absorb more of the clear drainage I have given you a DuoDERM dressing.  You can get these over-the-counter.  You could also use Vaseline with Kolls to keep it wrapped up.  If it becomes warm or starts draining pus again please be reevaluated.  If you have any questions or concerns call clinic.  Hope you have a great day!

## 2021-11-01 NOTE — Assessment & Plan Note (Signed)
Wound is healing well.  Finish course of doxycycline.  Recommend hydrocolloid dressing for continued improvement and Vaseline to help with moisturization and new skin formation.  Strict ED and return precautions given.

## 2022-02-27 ENCOUNTER — Encounter: Payer: Self-pay | Admitting: *Deleted

## 2022-05-22 ENCOUNTER — Other Ambulatory Visit (HOSPITAL_COMMUNITY)
Admission: RE | Admit: 2022-05-22 | Discharge: 2022-05-22 | Disposition: A | Discharge status: Home / Self Care | Payer: Self-pay | Source: Ambulatory Visit | Attending: Family Medicine | Admitting: Family Medicine

## 2022-05-22 ENCOUNTER — Encounter: Payer: Self-pay | Admitting: Student

## 2022-05-22 ENCOUNTER — Ambulatory Visit (INDEPENDENT_AMBULATORY_CARE_PROVIDER_SITE_OTHER): Payer: Self-pay | Admitting: Student

## 2022-05-22 ENCOUNTER — Other Ambulatory Visit: Payer: Self-pay

## 2022-05-22 VITALS — BP 134/78 | HR 70 | Wt 217.0 lb

## 2022-05-22 DIAGNOSIS — Z113 Encounter for screening for infections with a predominantly sexual mode of transmission: Secondary | ICD-10-CM | POA: Insufficient documentation

## 2022-05-22 NOTE — Patient Instructions (Signed)
It was great to see you! Thank you for allowing me to participate in your care!   Our plans for today:  We are checking some labs today, I will call you if they are abnormal will send you a MyChart message or a letter if they are normal.  If you do not hear about your labs in the next 2 weeks please let us know.  Take care and seek immediate care sooner if you develop any concerns.   Dr. Erick Alley, DO [facility]

## 2022-05-22 NOTE — Progress Notes (Unsigned)
    SUBJECTIVE:   CHIEF COMPLAINT / HPI:   STD screen Pt has one partner for over a year and has unprotected sex. Partner did not test positive for any STDs, they decided they would both get STD screening to be safe. He denies any penile discharge or penile or testicular pain, no dysuria. As pt is with one sexual partner and plans to stay with this partner for time being, does not want to start prep therapy at this time.   PERTINENT  PMH / PSH: Hx of STDs  OBJECTIVE:   Vitals:   05/22/22 1331  BP: 134/78  Pulse: 70  SpO2: 98%     General: NAD, pleasant, able to participate in exam Cardiac: RRR, no murmurs. Respiratory: CTAB, normal effort, No wheezes, rales or rhonchi Skin: warm and dry, no rashes noted Neuro: alert, no obvious focal deficits Psych: Normal affect and mood  ASSESSMENT/PLAN:   STD screening -urine GC/Chlmydia and trich -HIV, RRP, and hep C     Dr. Erick Alley, DO [facility]

## 2022-05-23 LAB — URINE CYTOLOGY ANCILLARY ONLY
Chlamydia: NEGATIVE
Comment: NEGATIVE
Comment: NEGATIVE
Comment: NORMAL
Neisseria Gonorrhea: NEGATIVE
Trichomonas: NEGATIVE

## 2022-05-23 NOTE — Assessment & Plan Note (Signed)
-  urine GC/Chlmydia and trich -HIV, RRP, and hep C

## 2022-05-24 LAB — RPR: RPR Ser Ql: NONREACTIVE

## 2022-05-24 LAB — HIV ANTIBODY (ROUTINE TESTING W REFLEX): HIV Screen 4th Generation wRfx: NONREACTIVE

## 2022-05-24 LAB — HCV INTERPRETATION

## 2022-05-24 LAB — HCV AB W REFLEX TO QUANT PCR: HCV Ab: NONREACTIVE

## 2022-10-19 ENCOUNTER — Encounter: Payer: Self-pay | Admitting: Family Medicine

## 2022-10-19 ENCOUNTER — Ambulatory Visit (INDEPENDENT_AMBULATORY_CARE_PROVIDER_SITE_OTHER): Payer: Self-pay | Admitting: Family Medicine

## 2022-10-19 ENCOUNTER — Other Ambulatory Visit: Payer: Self-pay

## 2022-10-19 ENCOUNTER — Other Ambulatory Visit (HOSPITAL_COMMUNITY)
Admission: RE | Admit: 2022-10-19 | Discharge: 2022-10-19 | Disposition: A | Discharge status: Home / Self Care | Payer: Self-pay | Source: Ambulatory Visit | Attending: Family Medicine | Admitting: Family Medicine

## 2022-10-19 VITALS — BP 116/62 | HR 77 | Ht 72.0 in | Wt 226.2 lb

## 2022-10-19 DIAGNOSIS — Z0000 Encounter for general adult medical examination without abnormal findings: Secondary | ICD-10-CM

## 2022-10-19 DIAGNOSIS — Z113 Encounter for screening for infections with a predominantly sexual mode of transmission: Secondary | ICD-10-CM

## 2022-10-19 NOTE — Patient Instructions (Signed)
It was great to see you!  Today we did routine testing for sexually transmitted infections. I will send you a MyChart message in approximately 2 days with the results or call if they are abnormal.  I highly recommend you get the flu shot. If you decide you're interested please let us know. You can also get it at your local pharmacy.  Things to do to keep yourself healthy  - Exercise at least 30-45 minutes a day, 3-4 days a week.  - Eat a diet with lots of fruits and vegetables (5 servings per day). - Avoid sugar sweetened beverages and processed foods whenever possible. - Seatbelts can save your life. Wear them always.  - Safe sex - use a condom to prevent STIs. - Alcohol -  If you drink, do it moderately, less than 2 drinks per day. - Sleep - aim for 7-8 hours nightly. - Limit screen time to no more than 2 hours per day.  - [place]. Choose someone to make decisions for you if you are not able-- please complete the blue advanced directives packet and return to our office at your earliest convenience. - Depression is common in our stressful world. If you're feeling down or losing interest in things you normally enjoy, please come in for a visit.  - Violence - If anyone is threatening or hurting you, please call immediately.    Take care, Dr Rock Nephew

## 2022-10-19 NOTE — Progress Notes (Signed)
    SUBJECTIVE:   Chief complaint/HPI: annual examination  Christopher Townsend is a 27 y.o. male who presents today for an annual exam.  Current concerns: requests STI testing, no new partners, no GU symptoms, did notice some red spots on left hand a few days ago which were itchy, but otherwise no symptoms  Reviewed and updated history.   Review of systems form reviewed and notable for: no advanced directives.    OBJECTIVE:   BP 116/62   Pulse 77   Ht 6' (1.829 m)   Wt 226 lb 3.2 oz (102.6 kg)   SpO2 100%   BMI 30.68 kg/m   Gen: NAD, pleasant, able to participate in exam HEENT: [place]/AT, PERRLA, nares patent bilaterally, TM normal bilaterally, oropharynx unremarkable Neck: supple, no cervical or supraclavicular lymphadenopathy CV: RRR, normal S1/S2, no murmur Resp: Normal effort, lungs CTAB GI: abdomen soft, non-tender, non-distended Extremities: no edema or cyanosis Skin: warm and dry, ~4 small erythematous papules on left dorsal hand along ulnar aspect, no vesicles, no ulcerations, no rash elsewhere Neuro: alert, no obvious focal deficits Psych: Normal affect and mood  ASSESSMENT/PLAN:   Annual Examination  See AVS for age appropriate recommendations  PHQ score 0, reviewed and discussed.  Blood pressure reviewed and at goal.   Advanced directive: none currently, states his mom would be decision maker. Given blue advanced directive packet to complete.   Considered the following items based upon USPSTF recommendations: HIV testing: ordered Hepatitis C:  negative 05/22/22, repeat not indicated Syphilis if at high risk: ordered GC/CT and trich ordered Lipid panel (nonfasting or fasting) discussed based upon AHA recommendations and offered, but patient declined. Reviewed risk factors for latent tuberculosis and not indicated Immunizations: recommended flu vaccine, patient declined  Rash Few small erythematous papules on left dorsal hand, appearance suggestive of mild  contact dermatitis. Will monitor for spontaneous resolution.   Follow up in 1 year or sooner if indicated.    Alcus Dad, MD [place]

## 2022-10-20 LAB — RPR: RPR Ser Ql: NONREACTIVE

## 2022-10-20 LAB — HIV ANTIBODY (ROUTINE TESTING W REFLEX): HIV Screen 4th Generation wRfx: NONREACTIVE

## 2022-10-22 LAB — URINE CYTOLOGY ANCILLARY ONLY
Chlamydia: NEGATIVE
Comment: NEGATIVE
Comment: NEGATIVE
Comment: NORMAL
Neisseria Gonorrhea: NEGATIVE
Trichomonas: NEGATIVE

## 2023-03-01 ENCOUNTER — Emergency Department (HOSPITAL_COMMUNITY)
Admission: EM | Admit: 2023-03-01 | Discharge: 2023-03-02 | Disposition: A | Discharge status: Home / Self Care | Payer: Self-pay | Attending: Emergency Medicine | Admitting: Emergency Medicine

## 2023-03-01 DIAGNOSIS — Y9367 Activity, basketball: Secondary | ICD-10-CM | POA: Insufficient documentation

## 2023-03-01 DIAGNOSIS — M25511 Pain in right shoulder: Secondary | ICD-10-CM | POA: Insufficient documentation

## 2023-03-01 DIAGNOSIS — X501XXA Overexertion from prolonged static or awkward postures, initial encounter: Secondary | ICD-10-CM | POA: Insufficient documentation

## 2023-03-01 NOTE — ED Triage Notes (Signed)
Pt. Arrives POV with his partner c/o shoulder pain. Pt. States that he was playing basketball when he tried to dive for the ball injuring his shoulder. Pt. Pulses are in tact. Pt. Is able to move his arm at the elbow and wiggle his fingers. Pt. Only able to raise the arm 45 degrees.

## 2023-03-02 ENCOUNTER — Other Ambulatory Visit: Payer: Self-pay

## 2023-03-02 ENCOUNTER — Emergency Department (HOSPITAL_COMMUNITY): Payer: Self-pay

## 2023-03-02 ENCOUNTER — Encounter (HOSPITAL_COMMUNITY): Payer: Self-pay

## 2023-03-02 NOTE — Discharge Instructions (Signed)
Can take tylenol or motrin as needed for pain. Wear sling for now. Call Dr. Kathline Magic office on Monday to get follow-up scheduled. Can return here for new concerns.

## 2023-03-02 NOTE — ED Provider Notes (Signed)
[place] EMERGENCY DEPARTMENT AT [facility] Provider Note   CSN: [id number] Arrival date & time: 03/01/23  2331     History  Chief Complaint  Patient presents with   Shoulder Injury    Christopher Townsend is a 27 y.o. male.  The history is provided by the patient and medical records.  Shoulder Injury   27 y.o. M presenting to the ED for right shoulder injury.  States he was playing basketball, dove for ball and went to swat at the ball to knock back into play and felt a pull/tear in his right shoulder.  He states significant pain when trying to lift his arm out to the side of above his head.  No prior injuries/surgeries in the past.  Right hand dominant.  No meds PTA.  Home Medications Prior to Admission medications   Medication Sig Start Date End Date Taking? Authorizing Provider  clotrimazole (LOTRIMIN) 1 % cream Apply to affected area 2 times daily 09/18/18   Mardella Layman, MD  ketoconazole (NIZORAL) 2 % shampoo Apply 1 application topically 2 (two) times a week. 01/09/18   Almon Hercules, MD      Allergies    Patient has no known allergies.    Review of Systems   Review of Systems  Musculoskeletal:  Positive for arthralgias.  All other systems reviewed and are negative.   Physical Exam Updated Vital Signs BP 126/73 (BP Location: Left Arm)   Pulse 67   Temp 98.2 F (36.8 C) (Oral)   Resp 18   Ht 6' (1.829 m)   Wt 95.8 kg   SpO2 98%   BMI 28.64 kg/m  Physical Exam Vitals and nursing note reviewed.  Constitutional:      Appearance: He is well-developed.  HENT:     Head: Normocephalic and atraumatic.  Eyes:     Conjunctiva/sclera: Conjunctivae normal.     Pupils: Pupils are equal, round, and reactive to light.  Cardiovascular:     Rate and Rhythm: Normal rate and regular rhythm.     Heart sounds: Normal heart sounds.  Pulmonary:     Effort: Pulmonary effort is normal.     Breath sounds: Normal breath sounds.  Abdominal:     General: Bowel  sounds are normal.     Palpations: Abdomen is soft.  Musculoskeletal:        General: Normal range of motion.     Cervical back: Normal range of motion.     Comments: Right shoulder normal in appearance without swelling or bony deformity, some tenderness along posterior shoulder, pain elicited with attempted abduction forward or to the side, normal ROM of elbow, wrist, fingers, normal grip strength  Skin:    General: Skin is warm and dry.  Neurological:     Mental Status: He is alert and oriented to person, place, and time.     ED Results / Procedures / Treatments   Labs (all labs ordered are listed, but only abnormal results are displayed) Labs Reviewed - No data to display  EKG None  Radiology DG Shoulder Right  Result Date: 03/02/2023 CLINICAL DATA:  Status post trauma. EXAM: RIGHT SHOULDER - 2+ VIEW COMPARISON:  None Available. FINDINGS: There is no evidence of fracture or dislocation. There is no evidence of arthropathy or other focal bone abnormality. Soft tissues are unremarkable. IMPRESSION: Negative. Electronically Signed   By: Aram Candela M.D.   On: 03/02/2023 00:46    Procedures Procedures    Medications Ordered in  ED Medications - No data to display  ED Course/ Medical Decision Making/ A&P                             Medical Decision Making Amount and/or Complexity of Data Reviewed Radiology: ordered and independent interpretation performed.   27 year old male here with right shoulder injury after diving for a ball while playing basketball.  Reports he felt a tear/pull in his right shoulder.  No acute deformity on exam, does have pain with abduction.  Arm is neurovascularly intact.  X-ray without any acute findings.  Possible rotator cuff injury.  Placed in shoulder sling, will refer to orthopedics for follow-up.  Can return here for any new or acute changes.  Final Clinical Impression(s) / ED Diagnoses Final diagnoses:  Acute pain of right shoulder     Rx / DC Orders ED Discharge Orders     None         Garlon Hatchet, PA-C 03/02/23 0122    Nira Conn, MD 03/03/23 [phone]

## 2023-05-23 ENCOUNTER — Ambulatory Visit (INDEPENDENT_AMBULATORY_CARE_PROVIDER_SITE_OTHER): Payer: Self-pay | Admitting: Student

## 2023-05-23 VITALS — BP 124/78 | HR 70 | Temp 98.2°F | Ht 72.0 in | Wt 196.4 lb

## 2023-05-23 DIAGNOSIS — R6889 Other general symptoms and signs: Secondary | ICD-10-CM | POA: Insufficient documentation

## 2023-05-23 LAB — POC SOFIA SARS ANTIGEN FIA: SARS Coronavirus 2 Ag: NEGATIVE

## 2023-05-23 NOTE — Progress Notes (Signed)
    SUBJECTIVE:   CHIEF COMPLAINT / HPI:   Christopher Townsend is a 27 year old male here for mild headache, body aches, vomiting that started 2 days ago.  Needs COVID test to be cleared to return to job.  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  He feels almost 100% back to normal.  He reports subjective fevers at home, but did not take his temperature. He is tolerating food and fluid as usual. Denies altered mental status, confusion, neck pain.  Does not take any daily medications.   PERTINENT  PMH / PSH: Nonsignificant  OBJECTIVE:   BP 124/78   Pulse 70   Temp 98.2 F (36.8 C)   Ht 6' (1.829 m)   Wt 196 lb 6.4 oz (89.1 kg)   SpO2 100%   BMI 26.64 kg/m   General: Pleasant, well-appearing, well-groomed, no distress HEENT: Pupils PERRLA.  Conjunctiva normal. CV: Regular rate and rhythm, no murmurs Respiratory: Speaks in full sentences.  Normal work of breathing on room air.  Lungs clear in all fields to auscultation Extremities: No swelling  ASSESSMENT/PLAN:   Flu-like symptoms Now resolved body aches, vomiting, headache. Well-appearing today. Likely viral illness.  COVID point-of-care test negative today in clinic. Provided work note via Clinical cytogeneticist that he may return to job without restrictions.   Patient desires STI screening today, but had just urinated prior to arrival at clinic.  I scheduled appointment for him to return tomorrow that he may have an exam as well as urine cytology, HIV and RPR.  Darral Dash, DO [facility]

## 2023-05-23 NOTE — Patient Instructions (Signed)
It was great seeing you today.  As we discussed, -Make sure to stay hydrated. -I will call you with COVID test results.  I will also send you a work Physicist, medical via [employer].  Schedule an appointment to return for other testing, make sure you do not urinate for at least 1 hour prior to your visit.   If you have any questions or concerns, please feel free to call the clinic.   Have a wonderful day,  Dr. Darral Dash [facility] [phone]

## 2023-05-23 NOTE — Assessment & Plan Note (Signed)
Now resolved body aches, vomiting, headache. Well-appearing today. Likely viral illness.  COVID point-of-care test negative today in clinic. Provided work note via Clinical cytogeneticist that he may return to job without restrictions.

## 2023-05-24 ENCOUNTER — Ambulatory Visit (INDEPENDENT_AMBULATORY_CARE_PROVIDER_SITE_OTHER): Payer: Self-pay | Admitting: Family Medicine

## 2023-05-24 ENCOUNTER — Encounter: Payer: Self-pay | Admitting: Family Medicine

## 2023-05-24 ENCOUNTER — Other Ambulatory Visit (HOSPITAL_COMMUNITY)
Admission: RE | Admit: 2023-05-24 | Discharge: 2023-05-24 | Disposition: A | Discharge status: Home / Self Care | Payer: Self-pay | Source: Ambulatory Visit | Attending: Family Medicine | Admitting: Family Medicine

## 2023-05-24 VITALS — BP 117/69 | HR 64 | Ht 72.0 in | Wt 199.4 lb

## 2023-05-24 DIAGNOSIS — Z113 Encounter for screening for infections with a predominantly sexual mode of transmission: Secondary | ICD-10-CM

## 2023-05-24 NOTE — Progress Notes (Signed)
    SUBJECTIVE:   CHIEF COMPLAINT / HPI:   Concern for STI: No current symptoms concerning for STI, but has had regular unprotected oral and vaginal sex. Denies fevers, chills, rashes, burning with urination, scrotal and abdominal pain.   PERTINENT  PMH / PSH: Past exposure to STI, pearly penile papules.  OBJECTIVE:   BP 117/69   Pulse 64   Ht 6' (1.829 m)   Wt 199 lb 6 oz (90.4 kg)   SpO2 100%   BMI 27.04 kg/m   General: A&O, NAD HEENT: No sign of trauma, no erythema or exudate in the mouth or throat. Cardiac: RRR, no m/r/g Respiratory: CTAB, normal WOB, no w/c/r GI: Soft, NTTP, non-distended  Extremities: NTTP, no peripheral edema.  ASSESSMENT/PLAN:   Screen for STD (sexually transmitted disease) Collected urine and blood for gc/chlamydia, RPR and HIV. Discussed with patient why herpes testing is not routinely done. Without evidence of outbreak, patient decided that herpes testing was not necessary. Advised patient on safe sex practices, and let him know that if any testing came back positive, we would call him and call in any appropriate medications.    Gerrit Heck, DO [facility]

## 2023-05-24 NOTE — Patient Instructions (Signed)
It was wonderful to see you today!  Today we ran some tests for STIs. As discussed, someone will call you if you have a positive test result. Otherwise, you will receive your results through mychart. If you have a positive result, we will send in the appropriate antibiotic or treatment.   Please call [phone] with any questions about today's appointment.   If you need any additional refills, please call your pharmacy before calling the office.  Gerrit Heck, DO Family Medicine

## 2023-05-24 NOTE — Assessment & Plan Note (Signed)
Collected urine and blood for gc/chlamydia, RPR and HIV. Discussed with patient why herpes testing is not routinely done. Without evidence of outbreak, patient decided that herpes testing was not necessary. Advised patient on safe sex practices, and let him know that if any testing came back positive, we would call him and call in any appropriate medications.

## 2023-05-25 LAB — RPR: RPR Ser Ql: NONREACTIVE

## 2023-05-25 LAB — HIV ANTIBODY (ROUTINE TESTING W REFLEX): HIV Screen 4th Generation wRfx: NONREACTIVE

## 2023-05-30 LAB — URINE CYTOLOGY ANCILLARY ONLY
Chlamydia: NEGATIVE
Comment: NEGATIVE
Comment: NEGATIVE
Comment: NORMAL
Neisseria Gonorrhea: NEGATIVE
Trichomonas: NEGATIVE

## 2023-08-10 ENCOUNTER — Encounter (HOSPITAL_COMMUNITY): Payer: Self-pay

## 2023-08-10 ENCOUNTER — Ambulatory Visit (HOSPITAL_COMMUNITY)
Admission: EM | Admit: 2023-08-10 | Discharge: 2023-08-10 | Disposition: A | Discharge status: Home / Self Care | Payer: PRIVATE HEALTH INSURANCE | Attending: Physician Assistant | Admitting: Physician Assistant

## 2023-08-10 DIAGNOSIS — J069 Acute upper respiratory infection, unspecified: Secondary | ICD-10-CM

## 2023-08-10 LAB — POC COVID19/FLU A&B COMBO
Covid Antigen, POC: NEGATIVE
Influenza A Antigen, POC: NEGATIVE
Influenza B Antigen, POC: NEGATIVE

## 2023-08-10 MED ORDER — FLUTICASONE PROPIONATE 50 MCG/ACT NA SUSP: 1.0000 | Freq: Every day | NASAL | 0 refills | Status: DC

## 2023-08-10 MED ORDER — PROMETHAZINE-DM 6.25-15 MG/5ML PO SYRP: 5.0000 mL | ORAL_SOLUTION | Freq: Three times a day (TID) | ORAL | 0 refills | Status: DC | PRN

## 2023-08-10 NOTE — Discharge Instructions (Signed)
You tested negative for COVID and flu.  I suspect you have a different virus.  Use fluticasone nasal spray to help with your congestion.  You can also use nasal saline and sinus rinses for additional symptom relief.  Take Promethazine DM up to 3 times a day for cough.  This will make you sleepy so do not drive or drink alcohol with taking it.  You can use over-the-counter medication including Mucinex, Tylenol, ibuprofen for pain relief.  If your symptoms or not improving within a week please return for reevaluation.  If anything worsens and you have worsening cough, shortness of breath, fever, chest pain, recurrent nausea/vomiting interfering with oral intake you should be seen immediately.

## 2023-08-10 NOTE — ED Provider Notes (Signed)
[facility]    CSN: [id number] Arrival date & time: 08/10/23  1217      History   Chief Complaint Chief Complaint  Patient presents with   Fever   Nasal Congestion    HPI Christopher Townsend is a 27 y.o. male.   Patient presents today with a 2 to 3-day history of URI symptoms.  Reports cough, fever, chills, shortness of breath, body aches, nausea, vomiting, diarrhea.  Denies any known sick contacts.  Reports his last episode of emesis or diarrhea was yesterday and he was able to eat and drink normally without any difficulty or recurrent symptoms today.  He has tried over-the-counter cough medicine without improvement of symptoms.  He has had COVID several years ago.  He has not had COVID-19 vaccinations.  He does have a history of seasonal allergies but does not take medication for this.  Denies history of diabetes, heart disease, asthma, COPD, smoking.  Denies any recent antibiotics or steroids.  He attempted to work yesterday but felt poorly and so had to leave.    Past Medical History:  Diagnosis Date   Anxiety    Heartburn 05/23/2017   Multiple allergies    Seasonal allergies    Tinea corporis 02/23/2014    Patient Active Problem List   Diagnosis Date Noted   Flu-like symptoms 05/23/2023   Screen for STD (sexually transmitted disease) 08/13/2018   Pearly penile papules 03/21/2017    History reviewed. No pertinent surgical history.     Home Medications    Prior to Admission medications   Medication Sig Start Date End Date Taking? Authorizing Provider  fluticasone (FLONASE) 50 MCG/ACT nasal spray Place 1 spray into both nostrils daily. 08/10/23  Yes Yeilyn Gent K, PA-C  promethazine-dextromethorphan (PROMETHAZINE-DM) 6.25-15 MG/5ML syrup Take 5 mLs by mouth 3 (three) times daily as needed for cough. 08/10/23  Yes Willy Pinkerton, Noberto Retort, PA-C    Family History Family History  Problem Relation Age of Onset   Diabetes Mother    Hypertension Mother    Asthma  Neg Hx    Cancer Neg Hx    Heart failure Neg Hx    Hyperlipidemia Neg Hx     Social History Social History   Tobacco Use   Smoking status: Never   Smokeless tobacco: Never  Vaping Use   Vaping status: Never Used  Substance Use Topics   Alcohol use: Yes    Comment: only on special occasions   Drug use: Yes    Types: Marijuana    Comment: Hx marijuana use     Allergies   Patient has no known allergies.   Review of Systems Review of Systems  Constitutional:  Positive for activity change, chills, fatigue and fever. Negative for appetite change.  HENT:  Positive for congestion, postnasal drip and sore throat. Negative for sinus pressure and sneezing.   Respiratory:  Positive for cough and shortness of breath.   Cardiovascular:  Negative for chest pain.  Gastrointestinal:  Positive for diarrhea, nausea and vomiting. Negative for abdominal pain.  Musculoskeletal:  Positive for arthralgias and myalgias.     Physical Exam Triage Vital Signs ED Triage Vitals  Encounter Vitals Group     BP 08/10/23 1413 108/72     Systolic BP Percentile --      Diastolic BP Percentile --      Pulse Rate 08/10/23 1415 82     Resp 08/10/23 1413 18     Temp 08/10/23 1415 98.2 F (36.8  C)     Temp Source 08/10/23 1415 Oral     SpO2 08/10/23 1413 95 %     Weight --      Height --      Head Circumference --      Peak Flow --      Pain Score 08/10/23 1417 0     Pain Loc --      Pain Education --      Exclude from Growth Chart --    No data found.  Updated Vital Signs BP 108/72 (BP Location: Left Arm)   Pulse 60   Temp 98.9 F (37.2 C) (Oral)   Resp 18   SpO2 95%   Visual Acuity Right Eye Distance:   Left Eye Distance:   Bilateral Distance:    Right Eye Near:   Left Eye Near:    Bilateral Near:     Physical Exam Vitals reviewed.  Constitutional:      General: He is awake.     Appearance: Normal appearance. He is well-developed. He is not ill-appearing.     Comments:  Very pleasant male appears stated age in no acute distress sitting comfortably in exam room  HENT:     Head: Normocephalic and atraumatic.     Right Ear: Tympanic membrane, ear canal and external ear normal. Tympanic membrane is not erythematous or bulging.     Left Ear: Tympanic membrane, ear canal and external ear normal. Tympanic membrane is not erythematous or bulging.     Nose: Nose normal.     Mouth/Throat:     Pharynx: Uvula midline. Posterior oropharyngeal erythema present. No oropharyngeal exudate or uvula swelling.  Cardiovascular:     Rate and Rhythm: Normal rate and regular rhythm.     Heart sounds: Normal heart sounds, S1 normal and S2 normal. No murmur heard. Pulmonary:     Effort: Pulmonary effort is normal. No accessory muscle usage or respiratory distress.     Breath sounds: Normal breath sounds. No stridor. No wheezing, rhonchi or rales.     Comments: Clear to auscultation bilaterally Abdominal:     Palpations: Abdomen is soft.     Tenderness: There is no abdominal tenderness. There is no right CVA tenderness, left CVA tenderness, guarding or rebound.  Neurological:     Mental Status: He is alert.  Psychiatric:        Behavior: Behavior is cooperative.      UC Treatments / Results  Labs (all labs ordered are listed, but only abnormal results are displayed) Labs Reviewed  POC COVID19/FLU A&B COMBO    EKG   Radiology No results found.  Procedures Procedures (including critical care time)  Medications Ordered in UC Medications - No data to display  Initial Impression / Assessment and Plan / UC Course  I have reviewed the triage vital signs and the nursing notes.  Pertinent labs & imaging results that were available during my care of the patient were reviewed by me and considered in my medical decision making (see chart for details).     Patient is well-appearing, afebrile, nontoxic, nontachycardic.  No evidence of acute infection on physical exam  though would warrant initiation of antibiotics.  Discussed likely viral etiology.  COVID and flu testing were negative in clinic today.  Chest x-ray was deferred as patient had no adventitious lung sounds on exam oxygen saturation was 95%.  Will treat symptomatically and fluticasone nasal spray as well as Promethazine DM was sent to pharmacy.  Discussed that Promethazine DM can be sedating and he is not to drive or drink alcohol with taking it.  He can use over-the-counter medication including Mucinex, Flonase, Tylenol, ibuprofen.  He can also use nasal saline and sinus rinses.  Recommended that he rest and drink plenty of fluid.  If his symptoms are not improving within a week he is to return for reevaluation.  If anything worsens he needs to be seen immediately.  Strict return precautions given.  Work excuse note provided.  Final Clinical Impressions(s) / UC Diagnoses   Final diagnoses:  Upper respiratory tract infection, unspecified type     Discharge Instructions      You tested negative for COVID and flu.  I suspect you have a different virus.  Use fluticasone nasal spray to help with your congestion.  You can also use nasal saline and sinus rinses for additional symptom relief.  Take Promethazine DM up to 3 times a day for cough.  This will make you sleepy so do not drive or drink alcohol with taking it.  You can use over-the-counter medication including Mucinex, Tylenol, ibuprofen for pain relief.  If your symptoms or not improving within a week please return for reevaluation.  If anything worsens and you have worsening cough, shortness of breath, fever, chest pain, recurrent nausea/vomiting interfering with oral intake you should be seen immediately.     ED Prescriptions     Medication Sig Dispense Auth. Provider   fluticasone (FLONASE) 50 MCG/ACT nasal spray Place 1 spray into both nostrils daily. 16 g Arney Mayabb K, PA-C   promethazine-dextromethorphan (PROMETHAZINE-DM) 6.25-15 MG/5ML  syrup Take 5 mLs by mouth 3 (three) times daily as needed for cough. 118 mL Audriella Blakeley K, PA-C      PDMP not reviewed this encounter.   Jeani Hawking, PA-C 08/10/23 1508

## 2023-08-10 NOTE — ED Triage Notes (Signed)
Pt presents to the office for cough, fever and chills x 3 days. Pt has been using natural supplements.

## 2024-05-11 ENCOUNTER — Ambulatory Visit (INDEPENDENT_AMBULATORY_CARE_PROVIDER_SITE_OTHER): Payer: PRIVATE HEALTH INSURANCE

## 2024-05-11 ENCOUNTER — Ambulatory Visit: Payer: Self-pay

## 2024-05-11 ENCOUNTER — Other Ambulatory Visit (HOSPITAL_COMMUNITY)
Admission: RE | Admit: 2024-05-11 | Discharge: 2024-05-11 | Disposition: A | Discharge status: Home / Self Care | Payer: PRIVATE HEALTH INSURANCE | Source: Ambulatory Visit | Attending: Family Medicine | Admitting: Family Medicine

## 2024-05-11 VITALS — BP 116/66 | HR 63 | Ht 72.0 in | Wt 204.2 lb

## 2024-05-11 DIAGNOSIS — R360 Urethral discharge without blood: Secondary | ICD-10-CM

## 2024-05-11 DIAGNOSIS — Z113 Encounter for screening for infections with a predominantly sexual mode of transmission: Secondary | ICD-10-CM

## 2024-05-11 LAB — POCT URINALYSIS DIP (MANUAL ENTRY)
Bilirubin, UA: NEGATIVE
Blood, UA: NEGATIVE
Glucose, UA: NEGATIVE mg/dL
Ketones, POC UA: NEGATIVE mg/dL
Leukocytes, UA: NEGATIVE
Nitrite, UA: NEGATIVE
Protein Ur, POC: NEGATIVE mg/dL
Spec Grav, UA: 1.02 (ref 1.010–1.025)
Urobilinogen, UA: 1 U/dL
pH, UA: 6 (ref 5.0–8.0)

## 2024-05-11 NOTE — Addendum Note (Signed)
 Addended by: Teresa Nicodemus C on: 05/11/2024 09:56 AM   Modules accepted: Orders

## 2024-05-11 NOTE — Patient Instructions (Addendum)
 Thank you for visiting the clinic today, it was good to see you!  Please always bring your medication bottles  In today's visit we discussed:  Discharge: I have ordered several labs and will let you know if they come back positive for anything. We will initiate treatment at that point. If you are positive for an STI, I will prepare a form for you to pick up for your partner to get treatment as well.  Please follow-up as needed  For any questions, please call the office at [phone] or send me a message in MyChart. Have a great day!  -Fairy Amy, MD  [facility] Resident, PGY-1

## 2024-05-11 NOTE — Progress Notes (Addendum)
    SUBJECTIVE:   CHIEF COMPLAINT / HPI:   Penile dyscharge: light green/white discharge that started 2 months ago, strong odor associated with it. No bleeding, dysuria, increased urinary urgency or frequency, fevers, chills, N/V, change in weight, ulcers or lesions. Stable partner for the last 4 years, no high risk drug use or sexual practices, no symptoms in partner. Was positive for chlamydia 5 years ago with different partner.  PERTINENT  PMH / PSH: Pearly penile papules.  OBJECTIVE:   BP 116/66   Pulse 63   Ht 6' (1.829 m)   Wt 204 lb 4 oz (92.6 kg)   SpO2 100%   BMI 27.70 kg/m    Cardiac: Regular rate and rhythm. Normal S1/S2. No murmurs, rubs, or gallops appreciated. Lungs: Clear bilaterally to ascultation.  Abdomen: Normoactive bowel sounds. No tenderness to deep or light palpation. No rebound or guarding.    GU: Small <1 mm pearly papules under well healed circumcision scars, no ulcerations or lesions seen, no discharge on exam. Psych: Pleasant and appropriate    ASSESSMENT/PLAN:   Assessment & Plan Routine screening for STI (sexually transmitted infection) History of screening in the past, negative for last 5 years. High suspicion for gonorrhea given history, UA negative so low suspicion for UTI. Positive for chalmydia 5 years ago. -Pending non catheter urine sample testing for  GC/Chlam/trich -ordered HIV, RPR, and Hep C Penile discharge, without blood      Christopher Amy, MD [facility]

## 2024-05-12 LAB — URINE CYTOLOGY ANCILLARY ONLY
Chlamydia: NEGATIVE
Comment: NEGATIVE
Comment: NEGATIVE
Comment: NORMAL
Neisseria Gonorrhea: NEGATIVE
Trichomonas: NEGATIVE

## 2024-05-18 ENCOUNTER — Other Ambulatory Visit: Payer: PRIVATE HEALTH INSURANCE

## 2024-05-18 DIAGNOSIS — R360 Urethral discharge without blood: Secondary | ICD-10-CM

## 2024-05-18 DIAGNOSIS — Z113 Encounter for screening for infections with a predominantly sexual mode of transmission: Secondary | ICD-10-CM

## 2024-05-19 LAB — RPR: RPR Ser Ql: NONREACTIVE

## 2024-05-19 LAB — HEPATITIS C ANTIBODY: Hep C Virus Ab: NONREACTIVE

## 2024-05-19 LAB — HIV ANTIBODY (ROUTINE TESTING W REFLEX): HIV Screen 4th Generation wRfx: NONREACTIVE

## 2024-07-02 ENCOUNTER — Ambulatory Visit (HOSPITAL_COMMUNITY)
Admission: RE | Admit: 2024-07-02 | Discharge: 2024-07-02 | Disposition: A | Discharge status: Home / Self Care | Payer: Self-pay | Source: Ambulatory Visit | Attending: Student | Admitting: Student

## 2024-07-02 ENCOUNTER — Encounter (HOSPITAL_COMMUNITY): Payer: Self-pay

## 2024-07-02 VITALS — BP 105/45 | HR 57 | Temp 98.2°F | Resp 16 | Ht 72.0 in | Wt 170.0 lb

## 2024-07-02 DIAGNOSIS — Z113 Encounter for screening for infections with a predominantly sexual mode of transmission: Secondary | ICD-10-CM | POA: Insufficient documentation

## 2024-07-02 LAB — HIV ANTIBODY (ROUTINE TESTING W REFLEX): HIV Screen 4th Generation wRfx: NONREACTIVE

## 2024-07-02 NOTE — ED Provider Notes (Addendum)
 [facility]    CSN: [id number] Arrival date & time: 07/02/24  0915      History   Chief Complaint Chief Complaint  Patient presents with   [facility] TRANSMITTED DISEASE   Appointment    HPI Crimson Beer is a 28 y.o. male presenting for STI screening-asymptomatic.  Medical history seasonal allergies.  The patient does not have any current symptoms or known exposure.  Denies penile discharge, penile discomfort, dysuria, abdominal pain, new back pain, fevers.  HPI  Past Medical History:  Diagnosis Date   Anxiety    Heartburn 05/23/2017   Multiple allergies    Seasonal allergies    Tinea corporis 02/23/2014    Patient Active Problem List   Diagnosis Date Noted   Flu-like symptoms 05/23/2023   Pearly penile papules 03/21/2017    History reviewed. No pertinent surgical history.     Home Medications    Prior to Admission medications   Not on File    Family History Family History  Problem Relation Age of Onset   Diabetes Mother    Hypertension Mother    Asthma Neg Hx    Cancer Neg Hx    Heart failure Neg Hx    Hyperlipidemia Neg Hx     Social History Social History   Tobacco Use   Smoking status: Never   Smokeless tobacco: Never  Vaping Use   Vaping status: Never Used  Substance Use Topics   Alcohol use: Yes    Comment: only on special occasions   Drug use: Yes    Types: Marijuana    Comment: Hx marijuana use     Allergies   Patient has no known allergies.   Review of Systems Review of Systems  Constitutional:  Negative for chills and fever.  HENT:  Negative for sore throat.   Eyes:  Negative for pain and redness.  Respiratory:  Negative for shortness of breath.   Cardiovascular:  Negative for chest pain.  Gastrointestinal:  Negative for abdominal pain, diarrhea, nausea and vomiting.  Genitourinary:  Negative for decreased urine volume, difficulty urinating, dysuria, flank pain, frequency, genital sores, hematuria and  urgency.  Musculoskeletal:  Negative for back pain.  Skin:  Negative for rash.     Physical Exam Triage Vital Signs ED Triage Vitals  Encounter Vitals Group     BP 07/02/24 0944 (!) 105/45     Girls Systolic BP Percentile --      Girls Diastolic BP Percentile --      Boys Systolic BP Percentile --      Boys Diastolic BP Percentile --      Pulse Rate 07/02/24 0944 (!) 57     Resp 07/02/24 0944 16     Temp 07/02/24 0944 98.2 F (36.8 C)     Temp Source 07/02/24 0944 Oral     SpO2 07/02/24 0944 97 %     Weight 07/02/24 0943 170 lb (77.1 kg)     Height 07/02/24 0943 6' (1.829 m)     Head Circumference --      Peak Flow --      Pain Score 07/02/24 0943 0     Pain Loc --      Pain Education --      Exclude from Growth Chart --    No data found.  Updated Vital Signs BP (!) 105/45 (BP Location: Left Arm)   Pulse (!) 57   Temp 98.2 F (36.8 C) (Oral)   Resp 16  Ht 6' (1.829 m)   Wt 170 lb (77.1 kg)   SpO2 97%   BMI 23.06 kg/m   Visual Acuity Right Eye Distance:   Left Eye Distance:   Bilateral Distance:    Right Eye Near:   Left Eye Near:    Bilateral Near:     Physical Exam Vitals reviewed.  Constitutional:      General: He is not in acute distress.    Appearance: Normal appearance. He is not ill-appearing or diaphoretic.  HENT:     Head: Normocephalic and atraumatic.  Cardiovascular:     Rate and Rhythm: Normal rate and regular rhythm.     Heart sounds: Normal heart sounds.  Pulmonary:     Effort: Pulmonary effort is normal.     Breath sounds: Normal breath sounds.  Skin:    General: Skin is warm.  Neurological:     General: No focal deficit present.     Mental Status: He is alert and oriented to person, place, and time.  Psychiatric:        Mood and Affect: Mood normal.        Behavior: Behavior normal.        Thought Content: Thought content normal.        Judgment: Judgment normal.      UC Treatments / Results  Labs (all labs ordered are  listed, but only abnormal results are displayed) Labs Reviewed  RPR  HIV ANTIBODY (ROUTINE TESTING W REFLEX)  CYTOLOGY, (ORAL, ANAL, URETHRAL) ANCILLARY ONLY    EKG   Radiology No results found.  Procedures Procedures (including critical care time)  Medications Ordered in UC Medications - No data to display  Initial Impression / Assessment and Plan / UC Course  I have reviewed the triage vital signs and the nursing notes.  Pertinent labs & imaging results that were available during my care of the patient were reviewed by me and considered in my medical decision making (see chart for details).     We are testing for gonorrhea, chlamydia, trichomonas, HIV, syphilis.  He is asymptomatic, and without a known exposure, so we will treat based on results.  Safe sex precautions.  Final Clinical Impressions(s) / UC Diagnoses   Final diagnoses:  Routine screening for STI (sexually transmitted infection)     Discharge Instructions      -We have sent testing for sexually transmitted infections. We will notify you of any positive results once they are received. This will take about 3 days. If required, we will prescribe any medications you might need. Please refrain from all sexual activity until treatment is complete.      ED Prescriptions   None    PDMP not reviewed this encounter.   Arlyss Leita BRAVO, PA-C 07/02/24 1041    Arlyss Leita BRAVO, PA-C 07/02/24 1041

## 2024-07-02 NOTE — Discharge Instructions (Addendum)
-  We have sent testing for sexually transmitted infections. We will notify you of any positive results once they are received. This will take about 3 days. If required, we will prescribe any medications you might need. Please refrain from all sexual activity until treatment is complete.

## 2024-07-02 NOTE — ED Triage Notes (Signed)
 Patient requesting full panel STD testing. Denies any current symptoms or known exposure.

## 2024-07-03 LAB — CYTOLOGY, (ORAL, ANAL, URETHRAL) ANCILLARY ONLY
Chlamydia: NEGATIVE
Comment: NEGATIVE
Comment: NEGATIVE
Comment: NORMAL
Neisseria Gonorrhea: NEGATIVE
Trichomonas: NEGATIVE

## 2024-07-03 LAB — RPR: RPR Ser Ql: NONREACTIVE

## 2024-08-03 ENCOUNTER — Encounter: Payer: Self-pay | Admitting: Family Medicine

## 2024-08-03 ENCOUNTER — Ambulatory Visit: Payer: Self-pay | Admitting: Family Medicine

## 2024-08-03 VITALS — BP 102/84 | HR 68 | Ht 72.0 in | Wt 205.2 lb

## 2024-08-03 DIAGNOSIS — M25521 Pain in right elbow: Secondary | ICD-10-CM

## 2024-08-03 NOTE — Progress Notes (Signed)
    SUBJECTIVE:   CHIEF COMPLAINT / HPI:   Discussed the use of AI scribe software for clinical note transcription with the patient, who gave verbal consent to proceed.  History of Present Illness Beau Gavia is a 28 year old male who presents with right elbow pain and stiffness.  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 Tends to be along his medial arm but sometimes affects all fingers - Numbness not currently present, these symptoms are rare  Prior treatments and interventions - No use of brace or medications for elbow pain except for topical Icy Hot - No trial of anti-inflammatory medications - No physical therapy attempted     PERTINENT  PMH / PSH: n/a  OBJECTIVE:   BP 102/84   Pulse 68   Ht 6' (1.829 m)   Wt 205 lb 3.2 oz (93.1 kg)   SpO2 100%   BMI 27.83 kg/m    General: NAD, pleasant, able to participate in exam Respiratory: No respiratory distress Skin: warm and dry, no rashes noted Psych: Normal affect and mood  MSK: R elbow/RUE: No gross deformity, ecchymosis, swelling Mildly tender to palpation over the olecranon itself.  No significant tenderness to palpation of medial or lateral epicondyles.  Nontender throughout forearm and upper arm, shoulder.  Nontender throughout wrist and hand. FROM  without pain of shoulder and elbow and wrist.  Experiences some stiffness in his elbow as he brings his forearm into full extension.  No popping. Negative Yergason's, negative Tinel's and Phalen. NVI distally, normal grip strength    ASSESSMENT/PLAN:    Assessment & Plan Right elbow pain Chronic right elbow pain with stiffness and intermittent numbness for 8-10 months, exacerbated by extension and lifting. Differential includes triceps inflammation or epicondylitis. Radiographs unlikely to show significant findings due to chronicity and lack of acute trauma. May have component of cubital and/or carpal tunnel syndrome but these do not appear to be severe on my exam. - Recommended Aleve (naproxen) for pain and inflammation PRN, double dose for next few days. - Advised use of Voltaren gel for topical pain relief. - Recommended neutral elbow brace to maintain extension, especially during sleep. - Recommended neutral wrist brace to prevent wrist flexion during sleep. - Referred to sports medicine for potential ultrasound evaluation if symptoms persist given patient preference to ensure he is anatomically ok - Referred to physical therapy for further management if symptoms do not improve. - Advised follow-up if symptoms do not improve.   Payton Coward, MD [facility]

## 2024-08-03 NOTE — Patient Instructions (Signed)
  VISIT SUMMARY: You visited us  today due to pain and stiffness in your right elbow, which has been bothering you for 8-10 months, especially after a basketball incident. You also mentioned occasional numbness in your right hand. We discussed several treatment options to help manage your symptoms.  YOUR PLAN: RIGHT ELBOW PAIN WITH STIFFNESS AND INTERMITTENT NUMBNESS: You have been experiencing chronic pain and stiffness in your right elbow, along with occasional numbness in your right hand. This is likely due to inflammation or a condition like epicondylitis. -Take Aleve (naproxen) for pain and inflammation, double the usual dose for the next few days. -Use Voltaren gel for topical pain relief. -Wear a neutral elbow brace to maintain extension, especially during sleep. -Wear a neutral wrist brace to prevent wrist flexion during sleep. -I have placed referrals to sports medicine and physical therapy -Follow up with us  if your symptoms do not improve.                      Contains text generated by Abridge.                                 Contains text generated by Abridge.

## 2024-09-23 ENCOUNTER — Ambulatory Visit
Admission: RE | Admit: 2024-09-23 | Discharge: 2024-09-23 | Disposition: A | Discharge status: Home / Self Care | Payer: Self-pay | Source: Ambulatory Visit | Attending: Family Medicine | Admitting: Family Medicine

## 2024-09-23 VITALS — BP 123/71 | HR 62 | Temp 99.6°F | Resp 18 | Ht 72.0 in | Wt 175.0 lb

## 2024-09-23 DIAGNOSIS — Z9189 Other specified personal risk factors, not elsewhere classified: Secondary | ICD-10-CM | POA: Insufficient documentation

## 2024-09-23 NOTE — Discharge Instructions (Signed)
 Check MyChart for your test results A nurse will call you if any of your tests are positive Avoid sexual relations until your results are completed

## 2024-09-23 NOTE — ED Provider Notes (Signed)
 " [facility]    CSN: [id number] Arrival date & time: 09/23/24  1353      History   Chief Complaint Chief Complaint  Patient presents with   SEXUALLY TRANSMITTED DISEASE    Just want to follow up on full check up and get tested for everything! - Entered by patient    HPI Christopher Townsend is a 28 y.o. male.   HPI Patient states he is asymptomatic Patient does not have any penile discharge or rash He requests STD testing Endorses unprotected sexual relations  Past Medical History:  Diagnosis Date   Anxiety    Heartburn 05/23/2017   Multiple allergies    Seasonal allergies    Tinea corporis 02/23/2014    Patient Active Problem List   Diagnosis Date Noted   Flu-like symptoms 05/23/2023   Pearly penile papules 03/21/2017    History reviewed. No pertinent surgical history.     Home Medications    Prior to Admission medications  Not on File    Family History Family History  Problem Relation Age of Onset   Diabetes Mother    Hypertension Mother    Asthma Neg Hx    Cancer Neg Hx    Heart failure Neg Hx    Hyperlipidemia Neg Hx     Social History Social History[1]   Allergies   Patient has no known allergies.   Review of Systems Review of Systems See HPI  Physical Exam Triage Vital Signs ED Triage Vitals  Encounter Vitals Group     BP 09/23/24 1434 123/71     Girls Systolic BP Percentile --      Girls Diastolic BP Percentile --      Boys Systolic BP Percentile --      Boys Diastolic BP Percentile --      Pulse Rate 09/23/24 1434 62     Resp 09/23/24 1434 18     Temp 09/23/24 1434 99.6 F (37.6 C)     Temp Source 09/23/24 1434 Oral     SpO2 09/23/24 1434 94 %     Weight 09/23/24 1433 175 lb (79.4 kg)     Height 09/23/24 1433 6' (1.829 m)     Head Circumference --      Peak Flow --      Pain Score 09/23/24 1433 0     Pain Loc --      Pain Education --      Exclude from Growth Chart --    No data found.  Updated Vital  Signs BP 123/71 (BP Location: Right Arm)   Pulse 62   Temp 99.6 F (37.6 C) (Oral)   Resp 18   Ht 6' (1.829 m)   Wt 79.4 kg   SpO2 94%   BMI 23.73 kg/m       Physical Exam Constitutional:      General: He is not in acute distress.    Appearance: He is well-developed.  HENT:     Head: Normocephalic and atraumatic.  Eyes:     Conjunctiva/sclera: Conjunctivae normal.     Pupils: Pupils are equal, round, and reactive to light.  Cardiovascular:     Rate and Rhythm: Normal rate.  Pulmonary:     Effort: Pulmonary effort is normal. No respiratory distress.  Musculoskeletal:        General: Normal range of motion.     Cervical back: Normal range of motion.  Skin:    General: Skin is warm and dry.  Neurological:     Mental Status: He is alert.      UC Treatments / Results  Labs (all labs ordered are listed, but only abnormal results are displayed) Labs Reviewed  HIV ANTIBODY (ROUTINE TESTING W REFLEX)  SYPHILIS: RPR W/REFLEX TO RPR TITER AND TREPONEMAL ANTIBODIES, TRADITIONAL SCREENING AND DIAGNOSIS ALGORITHM  CERVICOVAGINAL ANCILLARY ONLY    EKG   Radiology No results found.  Procedures Procedures (including critical care time)  Medications Ordered in UC Medications - No data to display  Initial Impression / Assessment and Plan / UC Course  I have reviewed the triage vital signs and the nursing notes.  Pertinent labs & imaging results that were available during my care of the patient were reviewed by me and considered in my medical decision making (see chart for details).     Swab and blood work is obtained Final Clinical Impressions(s) / UC Diagnoses   Final diagnoses:  At risk for sexually transmitted disease due to unprotected sex     Discharge Instructions      Check MyChart for your test results A nurse will call you if any of your tests are positive Avoid sexual relations until your results are completed   ED Prescriptions   None     PDMP not reviewed this encounter.    [1]  Social History Tobacco Use   Smoking status: Never   Smokeless tobacco: Never  Vaping Use   Vaping status: Never Used  Substance Use Topics   Alcohol use: Yes    Comment: only on special occasions   Drug use: Yes    Types: Marijuana    Comment: Hx marijuana use     Maranda Jamee Jacob, MD 09/23/24 2028  "

## 2024-09-23 NOTE — ED Triage Notes (Signed)
 Patient states that he is requesting STI testing including bloodwork.  Denies any sx's.

## 2024-09-24 LAB — HIV ANTIBODY (ROUTINE TESTING W REFLEX): HIV Screen 4th Generation wRfx: NONREACTIVE

## 2024-09-24 LAB — SYPHILIS: RPR W/REFLEX TO RPR TITER AND TREPONEMAL ANTIBODIES, TRADITIONAL SCREENING AND DIAGNOSIS ALGORITHM: RPR Ser Ql: NONREACTIVE

## 2024-09-25 LAB — CERVICOVAGINAL ANCILLARY ONLY
Chlamydia: NEGATIVE
Comment: NEGATIVE
Comment: NEGATIVE
Comment: NORMAL
Neisseria Gonorrhea: NEGATIVE
Trichomonas: NEGATIVE

## 2024-09-29 ENCOUNTER — Ambulatory Visit
Admission: RE | Admit: 2024-09-29 | Discharge: 2024-09-29 | Disposition: A | Discharge status: Home / Self Care | Payer: Self-pay | Attending: Internal Medicine | Admitting: Internal Medicine

## 2024-09-29 VITALS — BP 108/60 | HR 81 | Temp 99.1°F | Resp 14

## 2024-09-29 DIAGNOSIS — R051 Acute cough: Secondary | ICD-10-CM

## 2024-09-29 DIAGNOSIS — J101 Influenza due to other identified influenza virus with other respiratory manifestations: Secondary | ICD-10-CM

## 2024-09-29 DIAGNOSIS — R112 Nausea with vomiting, unspecified: Secondary | ICD-10-CM

## 2024-09-29 LAB — POC SOFIA SARS ANTIGEN FIA: SARS Coronavirus 2 Ag: NEGATIVE

## 2024-09-29 LAB — POCT INFLUENZA A/B
Influenza A, POC: POSITIVE — AB
Influenza B, POC: NEGATIVE

## 2024-09-29 MED ORDER — ONDANSETRON 4 MG PO TBDP
4.0000 mg | ORAL_TABLET | Freq: Once | ORAL | Status: AC
  Administered 2024-09-29: 4 mg via ORAL

## 2024-09-29 MED ORDER — OSELTAMIVIR PHOSPHATE 75 MG PO CAPS: 75.0000 mg | ORAL_CAPSULE | Freq: Two times a day (BID) | ORAL | 0 refills | Status: AC

## 2024-09-29 MED ORDER — ONDANSETRON 4 MG PO TBDP: 4.0000 mg | ORAL_TABLET | Freq: Three times a day (TID) | ORAL | 0 refills | Status: AC | PRN

## 2024-09-29 NOTE — ED Triage Notes (Signed)
 Pt c/o cough, body aches, dizziness, chills, vomiting and fatigue since yesterday.Taking tylenol , elderberry tea, dayquil and nyquil.

## 2024-09-29 NOTE — ED Provider Notes (Signed)
 [facility]    CSN: [id number] Arrival date & time: 09/29/24  9166      History   Chief Complaint Chief Complaint  Patient presents with   Fever    Body aching, hurts when I cough, cold & hot flashes. - Entered by patient    HPI Christopher Townsend is a 29 y.o. male.   Patient presents with cough, body aches, chills, nausea with vomiting, fatigue, nasal congestion that started yesterday. Denies diarrhea. Has taken tylenol , elderberry, dayquil for symptoms. Denies history of asthma. Reports difficulty keeping fluids down due to vomiting. Denies any known sick contacts. Patient denies any chronic health problems or that he takes any daily medications.    Fever   Past Medical History:  Diagnosis Date   Anxiety    Heartburn 05/23/2017   Multiple allergies    Seasonal allergies    Tinea corporis 02/23/2014    Patient Active Problem List   Diagnosis Date Noted   Flu-like symptoms 05/23/2023   Pearly penile papules 03/21/2017    History reviewed. No pertinent surgical history.     Home Medications    Prior to Admission medications  Not on File    Family History Family History  Problem Relation Age of Onset   Diabetes Mother    Hypertension Mother    Asthma Neg Hx    Cancer Neg Hx    Heart failure Neg Hx    Hyperlipidemia Neg Hx     Social History Social History[1]   Allergies   Patient has no known allergies.   Review of Systems Review of Systems Per HPI  Physical Exam Triage Vital Signs ED Triage Vitals  Encounter Vitals Group     BP 09/29/24 0853 108/60     Girls Systolic BP Percentile --      Girls Diastolic BP Percentile --      Boys Systolic BP Percentile --      Boys Diastolic BP Percentile --      Pulse Rate 09/29/24 0853 81     Resp 09/29/24 0853 14     Temp 09/29/24 0853 99.1 F (37.3 C)     Temp Source 09/29/24 0853 Oral     SpO2 09/29/24 0853 95 %     Weight --      Height --      Head Circumference --      Peak Flow  --      Pain Score 09/29/24 0850 8     Pain Loc --      Pain Education --      Exclude from Growth Chart --    No data found.  Updated Vital Signs BP 108/60 (BP Location: Right Arm)   Pulse 81   Temp 99.1 F (37.3 C) (Oral)   Resp 14   SpO2 95%   Visual Acuity Right Eye Distance:   Left Eye Distance:   Bilateral Distance:    Right Eye Near:   Left Eye Near:    Bilateral Near:     Physical Exam Constitutional:      General: He is not in acute distress.    Appearance: Normal appearance. He is not toxic-appearing or diaphoretic.  HENT:     Head: Normocephalic and atraumatic.     Right Ear: Tympanic membrane and ear canal normal.     Left Ear: Tympanic membrane and ear canal normal.     Nose: Congestion present.     Mouth/Throat:  Mouth: Mucous membranes are moist.     Pharynx: Posterior oropharyngeal erythema present. No pharyngeal swelling or oropharyngeal exudate.     Tonsils: No tonsillar exudate or tonsillar abscesses.  Eyes:     Extraocular Movements: Extraocular movements intact.     Conjunctiva/sclera: Conjunctivae normal.     Pupils: Pupils are equal, round, and reactive to light.  Cardiovascular:     Rate and Rhythm: Normal rate and regular rhythm.     Pulses: Normal pulses.     Heart sounds: Normal heart sounds.  Pulmonary:     Effort: Pulmonary effort is normal. No respiratory distress.     Breath sounds: Normal breath sounds. No stridor. No wheezing, rhonchi or rales.  Abdominal:     General: Bowel sounds are normal. There is no distension.     Palpations: Abdomen is soft.     Tenderness: There is no abdominal tenderness.  Musculoskeletal:        General: Normal range of motion.     Cervical back: Normal range of motion.  Skin:    General: Skin is warm and dry.  Neurological:     General: No focal deficit present.     Mental Status: He is alert and oriented to person, place, and time. Mental status is at baseline.  Psychiatric:        Mood and  Affect: Mood normal.        Behavior: Behavior normal.      UC Treatments / Results  Labs (all labs ordered are listed, but only abnormal results are displayed) Labs Reviewed  POCT INFLUENZA A/B - Abnormal; Notable for the following components:      Result Value   Influenza A, POC Positive (*)    All other components within normal limits  POC SOFIA SARS ANTIGEN FIA    EKG   Radiology No results found.  Procedures Procedures (including critical care time)  Medications Ordered in UC Medications  ondansetron  (ZOFRAN -ODT) disintegrating tablet 4 mg (4 mg Oral Given 09/29/24 0945)    Initial Impression / Assessment and Plan / UC Course  I have reviewed the triage vital signs and the nursing notes.  Pertinent labs & imaging results that were available during my care of the patient were reviewed by me and considered in my medical decision making (see chart for details).     Patient tested positive for Flu A. Will treat with Tamiflu . Ondansetron  administered in urgent care and patient was able to tolerate oral fluids after this medication so he is safe for discharge. Ondansetron  prescribed for home use as needed as well. Advised patient of supportive care including fluids and rest as well. Advised strict return precautions. Patient verbalized understanding and was agreeable with plan.  Final Clinical Impressions(s) / UC Diagnoses   Final diagnoses:  Acute cough  Influenza A  Nausea and vomiting, unspecified vomiting type     Discharge Instructions      You have tested positive for Flu A. I have prescribed Tamiflu  to help treat this. I have also prescribed a nausea medication to take as needed. Ensure you are drinking plenty of fluids and resting. Follow up if any symptoms persist or worsen.     ED Prescriptions   None    PDMP not reviewed this encounter.    [1]  Social History Tobacco Use   Smoking status: Never   Smokeless tobacco: Never  Vaping Use   Vaping  status: Never Used  Substance Use Topics   Alcohol  use: Yes    Comment: only on special occasions   Drug use: Yes    Types: Marijuana    Comment: Hx marijuana use     Hazen Darryle BRAVO, [place] 09/29/24 1043  "

## 2024-09-29 NOTE — Discharge Instructions (Signed)
 You have tested positive for Flu A. I have prescribed Tamiflu  to help treat this. I have also prescribed a nausea medication to take as needed. Ensure you are drinking plenty of fluids and resting. Follow up if any symptoms persist or worsen.
